# Patient Record
Sex: Female | Born: 1945 | Race: Black or African American | Hispanic: No | Marital: Married | Smoking: Never smoker
Health system: Southern US, Community
[De-identification: ages and names within clinical notes are randomized; demographics above are authoritative.]

## PROBLEM LIST (undated history)

## (undated) DIAGNOSIS — C50919 Malignant neoplasm of unspecified site of unspecified female breast: Secondary | ICD-10-CM

## (undated) DIAGNOSIS — R51 Headache: Secondary | ICD-10-CM

## (undated) DIAGNOSIS — M1990 Unspecified osteoarthritis, unspecified site: Secondary | ICD-10-CM

## (undated) DIAGNOSIS — E7800 Pure hypercholesterolemia, unspecified: Secondary | ICD-10-CM

## (undated) DIAGNOSIS — Z889 Allergy status to unspecified drugs, medicaments and biological substances status: Secondary | ICD-10-CM

## (undated) DIAGNOSIS — Z923 Personal history of irradiation: Secondary | ICD-10-CM

## (undated) DIAGNOSIS — M25569 Pain in unspecified knee: Secondary | ICD-10-CM

## (undated) DIAGNOSIS — R519 Headache, unspecified: Secondary | ICD-10-CM

## (undated) HISTORY — DX: Headache: R51

## (undated) HISTORY — PX: BREAST BIOPSY: SHX20

## (undated) HISTORY — PX: ABDOMINAL HYSTERECTOMY: SHX81

## (undated) HISTORY — DX: Headache, unspecified: R51.9

## (undated) HISTORY — PX: TUBAL LIGATION: SHX77

---

## 2014-04-11 ENCOUNTER — Other Ambulatory Visit: Payer: Self-pay

## 2014-04-11 DIAGNOSIS — Z1231 Encounter for screening mammogram for malignant neoplasm of breast: Secondary | ICD-10-CM

## 2014-04-26 ENCOUNTER — Ambulatory Visit
Admission: RE | Admit: 2014-04-26 | Discharge: 2014-04-26 | Disposition: A | Discharge status: Home / Self Care | Payer: Commercial Managed Care - HMO | Source: Ambulatory Visit

## 2014-04-26 DIAGNOSIS — Z1231 Encounter for screening mammogram for malignant neoplasm of breast: Secondary | ICD-10-CM

## 2014-08-26 ENCOUNTER — Emergency Department (HOSPITAL_BASED_OUTPATIENT_CLINIC_OR_DEPARTMENT_OTHER)
Admission: EM | Admit: 2014-08-26 | Discharge: 2014-08-26 | Disposition: A | Discharge status: Home / Self Care | Payer: Commercial Managed Care - HMO | Attending: Emergency Medicine | Admitting: Emergency Medicine

## 2014-08-26 ENCOUNTER — Encounter (HOSPITAL_BASED_OUTPATIENT_CLINIC_OR_DEPARTMENT_OTHER): Payer: Self-pay | Admitting: Emergency Medicine

## 2014-08-26 ENCOUNTER — Emergency Department (HOSPITAL_BASED_OUTPATIENT_CLINIC_OR_DEPARTMENT_OTHER): Payer: Commercial Managed Care - HMO

## 2014-08-26 DIAGNOSIS — M25512 Pain in left shoulder: Secondary | ICD-10-CM | POA: Diagnosis present

## 2014-08-26 DIAGNOSIS — Z79899 Other long term (current) drug therapy: Secondary | ICD-10-CM | POA: Insufficient documentation

## 2014-08-26 DIAGNOSIS — M62830 Muscle spasm of back: Secondary | ICD-10-CM | POA: Diagnosis not present

## 2014-08-26 DIAGNOSIS — M546 Pain in thoracic spine: Secondary | ICD-10-CM

## 2014-08-26 DIAGNOSIS — M549 Dorsalgia, unspecified: Secondary | ICD-10-CM

## 2014-08-26 DIAGNOSIS — E780 Pure hypercholesterolemia: Secondary | ICD-10-CM | POA: Diagnosis not present

## 2014-08-26 DIAGNOSIS — R9431 Abnormal electrocardiogram [ECG] [EKG]: Secondary | ICD-10-CM | POA: Diagnosis not present

## 2014-08-26 HISTORY — DX: Pure hypercholesterolemia, unspecified: E78.00

## 2014-08-26 LAB — CBC WITH DIFFERENTIAL/PLATELET
Basophils Absolute: 0 10*3/uL (ref 0.0–0.1)
Basophils Relative: 1 % (ref 0–1)
Eosinophils Absolute: 0.2 10*3/uL (ref 0.0–0.7)
Eosinophils Relative: 6 % — ABNORMAL HIGH (ref 0–5)
HCT: 37 % (ref 36.0–46.0)
Hemoglobin: 11.7 g/dL — ABNORMAL LOW (ref 12.0–15.0)
Lymphocytes Relative: 44 % (ref 12–46)
Lymphs Abs: 1.4 10*3/uL (ref 0.7–4.0)
MCH: 28 pg (ref 26.0–34.0)
MCHC: 31.6 g/dL (ref 30.0–36.0)
MCV: 88.5 fL (ref 78.0–100.0)
Monocytes Absolute: 0.5 10*3/uL (ref 0.1–1.0)
Monocytes Relative: 16 % — ABNORMAL HIGH (ref 3–12)
NEUTROS PCT: 33 % — AB (ref 43–77)
Neutro Abs: 1.1 10*3/uL — ABNORMAL LOW (ref 1.7–7.7)
PLATELETS: 228 10*3/uL (ref 150–400)
RBC: 4.18 MIL/uL (ref 3.87–5.11)
RDW: 13.7 % (ref 11.5–15.5)
WBC: 3.2 10*3/uL — AB (ref 4.0–10.5)

## 2014-08-26 LAB — BASIC METABOLIC PANEL
ANION GAP: 6 (ref 5–15)
BUN: 19 mg/dL (ref 6–23)
CO2: 26 mmol/L (ref 19–32)
Calcium: 9 mg/dL (ref 8.4–10.5)
Chloride: 107 mEq/L (ref 96–112)
Creatinine, Ser: 0.79 mg/dL (ref 0.50–1.10)
GFR calc Af Amer: >90 mL/min (ref >90)
GFR calc non Af Amer: 85 mL/min — ABNORMAL LOW (ref >90)
GLUCOSE: 111 mg/dL — AB (ref 70–99)
Potassium: 4 mmol/L (ref 3.5–5.1)
Sodium: 139 mmol/L (ref 135–145)

## 2014-08-26 LAB — TROPONIN I: Troponin I: <0.03 ng/mL (ref <0.031)

## 2014-08-26 MED ORDER — DIAZEPAM 2 MG PO TABS
2.0000 mg | ORAL_TABLET | Freq: Once | ORAL | Status: AC
  Administered 2014-08-26: 2 mg via ORAL
  Filled 2014-08-26: qty 1

## 2014-08-26 MED ORDER — HYDROCODONE-ACETAMINOPHEN 5-325 MG PO TABS
1.0000 | ORAL_TABLET | Freq: Once | ORAL | Status: AC
  Administered 2014-08-26: 1 via ORAL
  Filled 2014-08-26: qty 1

## 2014-08-26 MED ORDER — HYDROCODONE-ACETAMINOPHEN 5-325 MG PO TABS
2.0000 | ORAL_TABLET | ORAL | Status: DC | PRN
Start: 2014-08-26 — End: 2016-06-25

## 2014-08-26 MED ORDER — METHOCARBAMOL 500 MG PO TABS: 500.0000 mg | ORAL_TABLET | Freq: Two times a day (BID) | ORAL | Status: DC

## 2014-08-26 MED ORDER — FENTANYL CITRATE 0.05 MG/ML IJ SOLN
50.0000 ug | Freq: Once | INTRAMUSCULAR | Status: AC
  Administered 2014-08-26: 50 ug via INTRAVENOUS
  Filled 2014-08-26: qty 2

## 2014-08-26 MED ORDER — DIAZEPAM 2 MG PO TABS: 2.0000 mg | ORAL_TABLET | Freq: Two times a day (BID) | ORAL | Status: DC

## 2014-08-26 MED ORDER — IOHEXOL 350 MG/ML SOLN
100.0000 mL | Freq: Once | INTRAVENOUS | Status: AC | PRN
  Administered 2014-08-26: 100 mL via INTRAVENOUS

## 2014-08-26 NOTE — ED Provider Notes (Signed)
CSN: [id number]     Arrival date & time 08/26/14  0730 History  This chart was scribed for Caroline Essex, MD by Ludger Nutting, ED Scribe. This patient was seen in room MH03/MH03 and the patient's care was started 7:50 AM.    Chief Complaint  Patient presents with  . Shoulder Pain    The history is provided by the patient and the spouse. No language interpreter was used.     HPI Comments: Caroline Wilson is a 69 y.o. female who presents to the Emergency Department complaining of constant, gradually worsened left shoulder pain that began yesterday. She denies known falls or injuries. She reports similar symptoms in the past but not as severe. She states movement of the LUE aggravates her pain and certain movements alleviates her pain. She has taken Aleve and baby ASA without significant relief. Patient also complains of mild throat tightness that occurred this morning after waking. She states this tightness has since resolved. She denies chest pain, SOB, numbness or tingling.   Past Medical History  Diagnosis Date  . Hypercholesteremia    No past surgical history on file. No family history on file. History  Substance Use Topics  . Smoking status: Never Smoker   . Smokeless tobacco: Not on file  . Alcohol Use: Not on file   OB History    No data available     Review of Systems  A complete 10 system review of systems was obtained and all systems are negative except as noted in the HPI and PMH.    Allergies  Review of patient's allergies indicates no known allergies.  Home Medications   Prior to Admission medications   Medication Sig Start Date End Date Taking? Authorizing Provider  simvastatin (ZOCOR) 20 MG tablet Take 20 mg by mouth daily.   Yes Historical Provider, MD  diazepam (VALIUM) 2 MG tablet Take 1 tablet (2 mg total) by mouth 2 (two) times daily. 08/26/14   Caroline Essex, MD  HYDROcodone-acetaminophen (NORCO/VICODIN) 5-325 MG per tablet Take 2 tablets by mouth every 4  (four) hours as needed. 08/26/14   Caroline Essex, MD  methocarbamol (ROBAXIN) 500 MG tablet Take 1 tablet (500 mg total) by mouth 2 (two) times daily. 08/26/14   Caroline Essex, MD   BP 140/79 mmHg  Pulse 54  Temp(Src) 98 F (36.7 C) (Oral)  Resp 16  Ht 5\' 3"  (1.6 m)  Wt 179 lb (81.194 kg)  BMI 31.72 kg/m2  SpO2 96% Physical Exam  Constitutional: She is oriented to person, place, and time. She appears well-developed and well-nourished. No distress.  Uncomfortable appearing   HENT:  Head: Normocephalic and atraumatic.  Mouth/Throat: Oropharynx is clear and moist. No oropharyngeal exudate.  Eyes: Conjunctivae and EOM are normal. Pupils are equal, round, and reactive to light.  Neck: Normal range of motion. Neck supple.  No meningismus.  Cardiovascular: Normal rate, regular rhythm, normal heart sounds and intact distal pulses.   No murmur heard. Pulmonary/Chest: Effort normal and breath sounds normal. No respiratory distress.  Abdominal: Soft. There is no tenderness. There is no rebound and no guarding.  Musculoskeletal: Normal range of motion. She exhibits tenderness. She exhibits no edema.  Left paraspinal thoracic spasm, rhomboid spasm. Intact radial pulse. 5/5 grip strength. Pain worse with abduction of shoulder.   Neurological: She is alert and oriented to person, place, and time. No cranial nerve deficit. She exhibits normal muscle tone. Coordination normal.  No ataxia on finger to nose bilaterally. No pronator  drift. 5/5 strength throughout. CN 2-12 intact. Negative Romberg. Equal grip strength. Sensation intact. Gait is normal.   Skin: Skin is warm.  Psychiatric: She has a normal mood and affect. Her behavior is normal.  Nursing note and vitals reviewed.   ED Course  Procedures (including critical care time)  DIAGNOSTIC STUDIES: Oxygen Saturation is 99% on RA, normal by my interpretation.    COORDINATION OF CARE: 7:56 AM Discussed treatment plan with pt at bedside and pt  agreed to plan.  9:23 AM Patient rechecked, denies significant change in pain level.    Labs Review Labs Reviewed  CBC WITH DIFFERENTIAL - Abnormal; Notable for the following:    WBC 3.2 (*)    Hemoglobin 11.7 (*)    Neutrophils Relative % 33 (*)    Neutro Abs 1.1 (*)    Monocytes Relative 16 (*)    Eosinophils Relative 6 (*)    All other components within normal limits  BASIC METABOLIC PANEL - Abnormal; Notable for the following:    Glucose, Bld 111 (*)    GFR calc non Af Amer 85 (*)    All other components within normal limits  TROPONIN I    Imaging Review Dg Chest 2 View  08/26/2014   CLINICAL DATA:  Acute upper back pain since yesterday. No reported injury.  EXAM: CHEST  2 VIEW  COMPARISON:  None.  FINDINGS: The heart size and mediastinal contours are within normal limits. Both lungs are clear. No pneumothorax or pleural effusion is noted. The visualized skeletal structures are unremarkable.  IMPRESSION: No acute cardiopulmonary abnormality seen.   Electronically Signed   By: Sabino Dick M.D.   On: 08/26/2014 08:51   Ct Angio Chest Aorta W/cm &/or Wo/cm  08/26/2014   CLINICAL DATA:  Mid thoracic back pain.  EXAM: CT ANGIOGRAPHY CHEST WITH CONTRAST  TECHNIQUE: Multidetector CT imaging of the chest was performed using the standard protocol during bolus administration of intravenous contrast. Multiplanar CT image reconstructions and MIPs were obtained to evaluate the vascular anatomy.  CONTRAST:  14mL OMNIPAQUE IOHEXOL 350 MG/ML SOLN  COMPARISON:  Chest x-ray dated 08/26/2014  FINDINGS: There is no pulmonary embolism, aortic dissection, aortic aneurysm, pulmonary infiltrate, or pleural effusion. Minimal linear atelectasis at the lung bases.  Heart size is normal. No hilar or mediastinal adenopathy. No osseous abnormality. The visualized portion of the upper abdomen is normal.  Review of the MIP images confirms the above findings.  IMPRESSION: Normal exam.   Electronically Signed   By: Rozetta Nunnery M.D.   On: 08/26/2014 11:35     EKG Interpretation   Date/Time:  Monday August 26 2014 08:16:54 EST Ventricular Rate:  56 PR Interval:  154 QRS Duration: 86 QT Interval:  416 QTC Calculation: 401 R Axis:   51 Text Interpretation:  Sinus bradycardia Otherwise normal ECG No previous  ECGs available Confirmed by Armanda Forand  MD, Walden Statz ([id number]) on 08/26/2014  8:19:24 AM      MDM   Final diagnoses:  Back pain  Muscle spasm of back   Left upper back pain since yesterday worse with movement and palpation. Denies trauma. No chest pain or shortness of breath. No weakness, numbness or tingling.  EKG normal sinus rhythm. Left upper back pain tenderness reproducible to palpation.  Chest x-ray with normal mediastinal width. Equal upper extremity blood pressures.  CT negative for aortic dissection or aneurysm. BP improved.   Will treat as muscle spasm with pain control, antiinflammatories, muscle relaxers, follow  up with PCP. Return to the ED with new or worsening symptoms.  I personally performed the services described in this documentation, which was scribed in my presence. The recorded information has been reviewed and is accurate.  Caroline Essex, MD 08/26/14 1535

## 2014-08-26 NOTE — Discharge Instructions (Signed)
Muscle Cramps and Spasms Take the medications as needed for pain and spasm. Do not take her medications if you are working or driving. Follow-up with your doctor. Return to ED if you develop new or worsening symptoms. Muscle cramps and spasms occur when a muscle or muscles tighten and you have no control over this tightening (involuntary muscle contraction). They are a common problem and can develop in any muscle. The most common place is in the calf muscles of the leg. Both muscle cramps and muscle spasms are involuntary muscle contractions, but they also have differences:   Muscle cramps are sporadic and painful. They may last a few seconds to a quarter of an hour. Muscle cramps are often more forceful and last longer than muscle spasms.  Muscle spasms may or may not be painful. They may also last just a few seconds or much longer. CAUSES  It is uncommon for cramps or spasms to be due to a serious underlying problem. In many cases, the cause of cramps or spasms is unknown. Some common causes are:   Overexertion.   Overuse from repetitive motions (doing the same thing over and over).   Remaining in a certain position for a long period of time.   Improper preparation, form, or technique while performing a sport or activity.   Dehydration.   Injury.   Side effects of some medicines.   Abnormally low levels of the salts and ions in your blood (electrolytes), especially potassium and calcium. This could happen if you are taking water pills (diuretics) or you are pregnant.  Some underlying medical problems can make it more likely to develop cramps or spasms. These include, but are not limited to:   Diabetes.   Parkinson disease.   Hormone disorders, such as thyroid problems.   Alcohol abuse.   Diseases specific to muscles, joints, and bones.   Blood vessel disease where not enough blood is getting to the muscles.  HOME CARE INSTRUCTIONS   Stay well hydrated. Drink  enough water and fluids to keep your urine clear or pale yellow.  It may be helpful to massage, stretch, and relax the affected muscle.  For tight or tense muscles, use a warm towel, heating pad, or hot shower water directed to the affected area.  If you are sore or have pain after a cramp or spasm, applying ice to the affected area may relieve discomfort.  Put ice in a plastic bag.  Place a towel between your skin and the bag.  Leave the ice on for 15-20 minutes, 03-04 times a day.  Medicines used to treat a known cause of cramps or spasms may help reduce their frequency or severity. Only take over-the-counter or prescription medicines as directed by your caregiver. SEEK MEDICAL CARE IF:  Your cramps or spasms get more severe, more frequent, or do not improve over time.  MAKE SURE YOU:   Understand these instructions.  Will watch your condition.  Will get help right away if you are not doing well or get worse. Document Released: 01/29/2002 Document Revised: 12/04/2012 Document Reviewed: 07/26/2012 [facility] Patient Information 2015 [place], [place]. This information is not intended to replace advice given to you by your health care provider. Make sure you discuss any questions you have with your health care provider.

## 2014-08-26 NOTE — ED Notes (Signed)
Pt having left shoulder pain since yesterday.  Pt also c/o some throat tightness this am.  No known fever.  No difficulty swallowing.  No known injury.

## 2014-08-29 DIAGNOSIS — R7989 Other specified abnormal findings of blood chemistry: Secondary | ICD-10-CM | POA: Diagnosis not present

## 2014-08-29 DIAGNOSIS — M791 Myalgia: Secondary | ICD-10-CM | POA: Diagnosis not present

## 2014-10-01 DIAGNOSIS — Z111 Encounter for screening for respiratory tuberculosis: Secondary | ICD-10-CM | POA: Diagnosis not present

## 2014-10-01 DIAGNOSIS — Z021 Encounter for pre-employment examination: Secondary | ICD-10-CM | POA: Diagnosis not present

## 2014-10-18 DIAGNOSIS — H5210 Myopia, unspecified eye: Secondary | ICD-10-CM | POA: Diagnosis not present

## 2014-12-02 DIAGNOSIS — R51 Headache: Secondary | ICD-10-CM | POA: Diagnosis not present

## 2014-12-02 DIAGNOSIS — E780 Pure hypercholesterolemia: Secondary | ICD-10-CM | POA: Diagnosis not present

## 2014-12-02 DIAGNOSIS — Z79899 Other long term (current) drug therapy: Secondary | ICD-10-CM | POA: Diagnosis not present

## 2014-12-02 DIAGNOSIS — M549 Dorsalgia, unspecified: Secondary | ICD-10-CM | POA: Diagnosis not present

## 2014-12-10 ENCOUNTER — Ambulatory Visit: Payer: Commercial Managed Care - HMO | Attending: Family Medicine

## 2014-12-10 DIAGNOSIS — M545 Low back pain, unspecified: Secondary | ICD-10-CM

## 2014-12-10 DIAGNOSIS — G44221 Chronic tension-type headache, intractable: Secondary | ICD-10-CM

## 2014-12-10 DIAGNOSIS — M25659 Stiffness of unspecified hip, not elsewhere classified: Secondary | ICD-10-CM | POA: Insufficient documentation

## 2014-12-10 DIAGNOSIS — G44201 Tension-type headache, unspecified, intractable: Secondary | ICD-10-CM | POA: Diagnosis not present

## 2014-12-10 NOTE — Patient Instructions (Signed)
Perform all exercises below:  Hold _20___ seconds. Repeat _3___ times.  Do __3__ sessions per day. CAUTION: Movement should be gentle, steady and slow.  Knee to Chest  Lying supine, bend involved knee to chest. Perform with each leg.  Copyright  VHI. All rights reserved.  Double Knee to Chest (Flexion)   Gently pull both knees toward chest. Feel stretch in lower back or buttock area. Breathing deeply, Lumbar Rotation: Caudal - Bilateral (Supine)  Feet and knees together, arms outstretched, rotate knees left, turning head in opposite direction, until stretch is felt.      PERFORM ALL EXERCISES GENTLY AND WITH GOOD POSTURE.    20 SECOND HOLD, 3 REPS TO EACH SIDE. 4-5 TIMES EACH DAY.   AROM: Neck Rotation   Turn head slowly to look over one shoulder, then the other.   AROM: Neck Flexion   Bend head forward.   AROM: Lateral Neck Flexion   Slowly tilt head toward one shoulder, then the other.

## 2014-12-10 NOTE — Therapy (Signed)
[facility] [address] [place], [place], [address] Phone: [phone]   Fax:  [phone]  Physical Therapy Evaluation  Patient Details  Name: Caroline Wilson MRN: [id number] Date of Birth: December 01, 1945 Referring Provider:  Leighton Ruff, MD  Encounter Date: 12/10/2014      PT End of Session - 12/10/14 1103    Visit Number 1   Number of Visits 10  Medicare   Date for PT Re-Evaluation 02/04/15   PT Start Time 2694   PT Stop Time 1118   PT Time Calculation (min) 57 min   Activity Tolerance Patient tolerated treatment well   Behavior During Therapy [facility] for tasks assessed/performed      Past Medical History  Diagnosis Date  . Hypercholesteremia     History reviewed. No pertinent past surgical history.  There were no vitals filed for this visit.  Visit Diagnosis:  Bilateral low back pain without sciatica - Plan: PT plan of care cert/re-cert  Chronic tension-type headache, intractable - Plan: PT plan of care cert/re-cert  Hip stiffness, unspecified laterality - Plan: PT plan of care cert/re-cert      Subjective Assessment - 12/10/14 1028    Subjective Pt is a 69 y.o. female who presents to PT with chronic history of LBP without incident or injury.  Pt with new onset of headaches 5 months ago.     Limitations Standing;Walking   How long can you stand comfortably? 1 hour max   How long can you walk comfortably? 1 hour at the gym   Diagnostic tests MRI at the hospital 08/2014 for muscle spasms in the thoracic spine- results negative   Patient Stated Goals reduce pain, stand longer, reduce headaches   Currently in Pain? Yes   Pain Score 8   8/10 max, 5/10 on average   Pain Location Back   Pain Orientation Right;Left   Pain Descriptors / Indicators Aching;Tightness   Pain Type Chronic pain   Pain Onset More than a month ago   Pain Frequency Constant   Aggravating Factors  early morning hours, sitting still  for too long in the car, housework, walking/standing > 1 hour   Pain Relieving Factors Aleve, some stretching   Effect of Pain on Daily Activities limited standing, pain with housework   Multiple Pain Sites Yes   Pain Score 6   Pain Location Head  headache   Pain Orientation Left;Right   Pain Descriptors / Indicators Aching;Throbbing   Pain Type Chronic pain   Pain Onset More than a month ago   Pain Frequency Intermittent   Aggravating Factors  unknown- pt denies a pain pattern   Pain Relieving Factors Aleve- doesn't always help            [facility] PT Assessment - 12/10/14 0001    Assessment   Medical Diagnosis back pain (M54.9) and headaches   Onset Date 12/10/11  Headaches started 06/2014   Next MD Visit none   Precautions   Precautions None   Restrictions   Weight Bearing Restrictions No   Balance Screen   Has the patient fallen in the past 6 months No   Has the patient had a decrease in activity level because of a fear of falling?  No   Is the patient reluctant to leave their home because of a fear of falling?  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  No pain repored with cervical AROM.  Lumbar AROM is full with  lumbar pain reported with end range flexion and Lt sidebending.     PROM   Overall PROM  Deficits   Overall PROM Comments Hip flexibility is limited by 25% in all directions without pain.     Strength   Overall Strength Within functional limits for tasks performed   Overall Strength Comments UE and LE strength 4+/5 bilaterally   Palpation   Palpation Pt with muscle tension noted in Rt and Lt UT and suboccipitals.  Tenderness with palpation to suboccipitals.    Palpabalbe tenderness over bilateral SI joint and deep gluteals.  No tenderness over lumbar paraspinals.     Ambulation/Gait   Ambulation/Gait Yes                   [facility] PT Treatment/Exercise - 12/10/14 0001    Exercises   Exercises Knee/Hip;Neck;Lumbar   Neck Exercises: Seated   Other Seated Exercise cervical AROM: 3 ways 3x20 seconds   Lumbar Exercises: Stretches   Single Knee to Chest Stretch 3 reps;20 seconds   Double Knee to Chest Stretch 3 reps;20 seconds   Lower Trunk Rotation 3 reps;20 seconds   Modalities   Modalities Electrical Stimulation;Moist Heat   Moist Heat Therapy   Number Minutes Moist Heat 15 Minutes   Moist Heat Location Other (comment)  neck and low back   Electrical Stimulation   Electrical Stimulation Location lumbar    Electrical Stimulation Action IFC   Electrical Stimulation Parameters 15   Electrical Stimulation Goals Pain                PT Education - 12/10/14 1049    Education provided Yes   Education Details lumbar flexibility: trunk rotation, single and double knee to chest.  Neck AROM: 3 ways   Person(s) Educated Patient   Methods Explanation;Demonstration;Handout   Comprehension Verbalized understanding;Returned demonstration          PT Short Term Goals - 12/10/14 1107    PT SHORT TERM GOAL #1   Title be independent in initial HEP   Time 4   Period Weeks   Status New   PT SHORT TERM GOAL #2   Title report a 25% reduction in LBP with standing tasks  and ADLs   Time 4   Period Weeks   Status New   PT SHORT TERM GOAL #3   Title reporta 25% reduction in the frequency and intensity of headaches   Time 4   Period Weeks   Status New           PT Long Term Goals - 12/10/14 1107    PT LONG TERM GOAL #1   Title be independent in advanced HEP   Time 8   Period Weeks  Status New   PT LONG TERM GOAL #2   Title reduce FOTO to < or = to 40% limitation   Time 8   Period Weeks   Status New   PT LONG TERM GOAL #3   Title report a 50% reduction in LBP with standing tasks and ADLs   Time 8   Period Weeks   Status New   PT LONG TERM GOAL #5   Title report a 50% reduction in the frequency and intensity of headaches   Time 8   Period Weeks   Status New               Plan - 12-17-2014 1104    Clinical Impression Statement Pt presents with chronic LBP, LE/hip stiffness and limited functional mobility.  Pt also with headaches and head/neck tension.  Pt will benefit from PT to address flexibility, muscle tension, strength and body mechanics   Pt will benefit from skilled therapeutic intervention in order to improve on the following deficits Decreased range of motion;Postural dysfunction;Improper body mechanics;Decreased activity tolerance;Pain;Increased muscle spasms   Rehab Potential Good   PT Frequency 2x / week   PT Duration 8 weeks   PT Treatment/Interventions ADLs/Self Care Home Management;Moist Heat;Therapeutic activities;Patient/family education;Traction;Therapeutic exercise;Passive range of motion;Manual techniques;Ultrasound;Cryotherapy;Neuromuscular re-education;Electrical Stimulation   PT Next Visit Plan Lumbar and hip flexibility, cervical flexibility, postural strength, body mechanics, modalities and manual   Consulted and Agree with Plan of Care Patient          G-Codes - 12/17/14 1051    Functional Assessment Tool Used FOTO: 45% limitation   Functional Limitation Other PT primary   Other PT Primary Current  Status (G9211) At least 40 percent but less than 60 percent impaired, limited or restricted   Other PT Primary Goal Status (H4174) At least 40 percent but less than 60 percent impaired, limited or restricted       Problem List There are no active problems to display for this patient.   TAKACS,KELLY, PT 12-17-14, 11:20 AM  [facility] [address], [place], [place], [address] Phone: [phone]   Fax:  [phone]

## 2014-12-17 ENCOUNTER — Ambulatory Visit: Payer: Commercial Managed Care - HMO

## 2014-12-17 DIAGNOSIS — M545 Low back pain, unspecified: Secondary | ICD-10-CM

## 2014-12-17 DIAGNOSIS — M25659 Stiffness of unspecified hip, not elsewhere classified: Secondary | ICD-10-CM

## 2014-12-17 DIAGNOSIS — G44221 Chronic tension-type headache, intractable: Secondary | ICD-10-CM

## 2014-12-17 DIAGNOSIS — G44201 Tension-type headache, unspecified, intractable: Secondary | ICD-10-CM | POA: Diagnosis not present

## 2014-12-17 NOTE — Therapy (Signed)
[facility] [address], [place], [place], [address] Phone: [phone]   Fax:  [phone]  Physical Therapy Treatment  Patient Details  Name: Caroline Wilson MRN: [id number] Date of Birth: August 03, 1946 Referring Provider:  Leighton Ruff, MD  Encounter Date: 12/17/2014      PT End of Session - 12/17/14 0842    Visit Number 2   Number of Visits 10  Medicare   Date for PT Re-Evaluation 02/04/15   PT Start Time 0806   PT Stop Time 0859   PT Time Calculation (min) 53 min   Activity Tolerance Patient tolerated treatment well   Behavior During Therapy [facility] for tasks assessed/performed      Past Medical History  Diagnosis Date  . Hypercholesteremia     History reviewed. No pertinent past surgical history.  There were no vitals filed for this visit.  Visit Diagnosis:  Bilateral low back pain without sciatica  Chronic tension-type headache, intractable  Hip stiffness, unspecified laterality      Subjective Assessment - 12/17/14 0809    Subjective Pt denies any change in headaches.  Pt has been doing exercises.  E-stim and heat helped last session.     Currently in Pain? Yes   Pain Score 5    Pain Location Back   Pain Orientation Right;Left   Pain Descriptors / Indicators Aching;Tightness   Pain Type Chronic pain   Pain Onset More than a month ago   Pain Frequency Constant   Aggravating Factors  early morning hours, housework, standing/walking >1 hour   Pain Relieving Factors Aleve   Multiple Pain Sites Yes   Pain Score 6   Pain Location Head   Pain Orientation Right;Left   Pain Descriptors / Indicators Aching;Throbbing   Pain Type Chronic pain   Pain Onset More than a month ago   Pain Frequency Intermittent   Aggravating Factors  unknonwn pattern                         [facility] Adult PT Treatment/Exercise - 12/17/14 0001    Neck Exercises: Seated   Other Seated Exercise cervical  AROM: 3 ways 3x20 seconds   Lumbar Exercises: Stretches   Active Hamstring Stretch 3 reps;20 seconds   Single Knee to Chest Stretch 3 reps;20 seconds   Double Knee to Chest Stretch 3 reps;20 seconds   Lower Trunk Rotation 3 reps;20 seconds  good return demo of all HEP   Lumbar Exercises: Aerobic   Stationary Bike Level 2x 6 minutes  PT present to discuss progress   Modalities   Modalities Electrical Stimulation;Moist Heat;Ultrasound   Moist Heat Therapy   Number Minutes Moist Heat 15 Minutes   Moist Heat Location Other (comment)  neck and lumbar   Electrical Stimulation   Electrical Stimulation Location lumbar   Electrical Stimulation Action IFC   Electrical Stimulation Parameters 15   Electrical Stimulation Goals Pain   Ultrasound   Ultrasound Location neck   Ultrasound Parameters 1.2 w/cm2 continuous x 6 minutes   Ultrasound Goals Pain                PT Education - 12/17/14 6440    Education provided Yes   Education Details HEP: chin tuck in supine, seated hamstring stretch.     Person(s) Educated Patient   Methods Demonstration;Handout;Explanation   Comprehension Verbalized understanding;Returned demonstration          PT Short Term Goals - 12/17/14  0811    PT SHORT TERM GOAL #1   Title be independent in initial HEP   Time 4   Period Weeks   Status --  independent in initial HEP from 1st session   PT [place] #2   Title report a 25% reduction in LBP with standing tasks and ADLs   Time 4   Period Weeks   Status On-going  No significant change since the start of care   PT SHORT TERM GOAL #3   Title reporta 25% reduction in the frequency and intensity of headaches   Time 4   Period Weeks   Status On-going  No change since the start of care           PT Long Term Goals - 12/10/14 1107    PT LONG TERM GOAL #1   Title be independent in advanced HEP   Time 8   Period Weeks   Status New   PT LONG TERM GOAL #2   Title reduce FOTO to < or  = to 40% limitation   Time 8   Period Weeks   Status New   PT LONG TERM GOAL #3   Title report a 50% reduction in LBP with standing tasks and ADLs   Time 8   Period Weeks   Status New   PT LONG TERM GOAL #5   Title report a 50% reduction in the frequency and intensity of headaches   Time 8   Period Weeks   Status New               Plan - 12/17/14 8891    Clinical Impression Statement Pt with only 1 session since evaluation.  Pt is independent in initial HEP.  No significant progress toward goals secondary to only 1 session.  Pt with continued LBP and LE/hip stiffness.     Pt will benefit from skilled therapeutic intervention in order to improve on the following deficits Decreased range of motion;Postural dysfunction;Improper body mechanics;Decreased activity tolerance;Pain;Increased muscle spasms   Rehab Potential Good   PT Frequency 2x / week   PT Duration 8 weeks   PT Treatment/Interventions ADLs/Self Care Home Management;Moist Heat;Therapeutic activities;Patient/family education;Traction;Therapeutic exercise;Passive range of motion;Manual techniques;Ultrasound;Cryotherapy;Neuromuscular re-education;Electrical Stimulation   PT Next Visit Plan Lumbar and hip flexibility, cervical flexibility, postural strength,  modalities and manual.  Body mechanics education for home tasks.    Consulted and Agree with Plan of Care Patient        Problem List There are no active problems to display for this patient.   TAKACS,KELLY, PT 12/17/2014, 8:43 AM  [facility] [address], [place], [place], [address] Phone: [phone]   Fax:  [phone]

## 2014-12-17 NOTE — Patient Instructions (Signed)
Head Press With [place] chin SLIGHTLY toward chest, keep mouth closed. Feel weight on back of head. Increase weight by pressing head down. Hold 5__ seconds. Relax. Repeat 10___ times. Surface: floor.  Do 2-3 times a day  Copyright  VHI. All rights reserved.  HIP: Hamstrings - Short Sitting   Rest leg on raised surface. Keep knee straight. Lift chest. Hold _20__ seconds. 3___ reps per set, _3__ sets per day Copyright  VHI. All rights reserved.

## 2014-12-24 ENCOUNTER — Ambulatory Visit: Payer: Commercial Managed Care - HMO | Attending: Family Medicine

## 2014-12-24 DIAGNOSIS — G44221 Chronic tension-type headache, intractable: Secondary | ICD-10-CM

## 2014-12-24 DIAGNOSIS — G44201 Tension-type headache, unspecified, intractable: Secondary | ICD-10-CM | POA: Diagnosis not present

## 2014-12-24 DIAGNOSIS — M545 Low back pain, unspecified: Secondary | ICD-10-CM

## 2014-12-24 DIAGNOSIS — M25659 Stiffness of unspecified hip, not elsewhere classified: Secondary | ICD-10-CM | POA: Insufficient documentation

## 2014-12-24 NOTE — Patient Instructions (Signed)
TIGHTEN YOUR ABDOMINALS AS YOU DO THIS:  ABDUCTION: Standing (Active)   Stand, feet flat. Lift right leg out to side. Use _0__ lbs. Complete _2x_10_ repetitions. Perform __2_ sessions per day.   EXTENSION: Standing (Active)  Stand, both feet flat. Draw right leg behind body as far as possible. Use 0___ lbs. Complete 10 repetitions. Perform __2_ sessions per day.  Copyright  VHI. All rights reserved.   KEEP HEAD IN NEUTRAL AND SHOULDERS DOWN AND RELAXED   Both arms at the same time. Pull arm back, elbow straight. Repeat __10__ times per set. Do __2__ sets per session. Do _1-2___ sessions per day.  Copyright  VHI. All rights reserved.     With resistive band anchored in door, grasp both ends. Keeping elbows bent, pull back, squeezing shoulder blades together. Hold _3__ seconds. Repeat _2x10___ times. Do _1-2___ sessions per day.  [email or web address].[email or web address]

## 2014-12-24 NOTE — Therapy (Signed)
[facility] [address], [place], [place], [address] Phone: [phone]   Fax:  [phone]  Physical Therapy Treatment  Patient Details  Name: Caroline Wilson MRN: [id number] Date of Birth: 1946-01-05 Referring Provider:  Leighton Ruff, MD  Encounter Date: 12/24/2014      PT End of Session - 12/24/14 0958    Visit Number 3   Number of Visits 10  Medicare   Date for PT Re-Evaluation 02/04/15   PT Start Time 0931   PT Stop Time 1032   PT Time Calculation (min) 61 min   Activity Tolerance Patient tolerated treatment well   Behavior During Therapy [facility] for tasks assessed/performed      Past Medical History  Diagnosis Date  . Hypercholesteremia     History reviewed. No pertinent past surgical history.  There were no vitals filed for this visit.  Visit Diagnosis:  Bilateral low back pain without sciatica  Chronic tension-type headache, intractable  Hip stiffness, unspecified laterality      Subjective Assessment - 12/24/14 0934    Subjective Pain is better.  Headaches are not as frequent.     Currently in Pain? Yes   Pain Score 5    Pain Location Back   Pain Orientation Right;Left   Pain Descriptors / Indicators Tightness   Pain Type Chronic pain   Pain Onset More than a month ago   Pain Frequency Intermittent   Aggravating Factors  early morning hours, standing long periods, housework   Pain Relieving Factors Aleve when pain is bad, sitting upright with pillow behind the back.     Multiple Pain Sites Yes   Pain Score 5   Pain Location Head   Pain Orientation Right;Left   Pain Descriptors / Indicators Aching;Throbbing   Pain Type Chronic pain   Pain Onset More than a month ago   Pain Frequency Intermittent   Aggravating Factors  unknown pattern   Pain Relieving Factors meds dont help                         [facility] Adult PT Treatment/Exercise - 12/24/14 0001    Neck Exercises:  Theraband   Scapula Retraction 20 reps;Red   Shoulder Extension 20 reps;Red  added to HEP   Neck Exercises: Seated   Other Seated Exercise cervical AROM: 3 ways 1x20 seconds   Lumbar Exercises: Stretches   Active Hamstring Stretch 3 reps;20 seconds   Single Knee to Chest Stretch 3 reps;20 seconds   Double Knee to Chest Stretch 3 reps;20 seconds   Lower Trunk Rotation 3 reps;20 seconds   Lumbar Exercises: Aerobic   Stationary Bike Level 2x 8 minutes  PT present to discuss progress   UBE (Upper Arm Bike) Level 1 x 5 minutes   Lumbar Exercises: Standing   Other Standing Lumbar Exercises standing hip abduction and extension 2x10  added to HEP   Modalities   Modalities Electrical Stimulation;Moist Heat   Moist Heat Therapy   Number Minutes Moist Heat 15 Minutes   Moist Heat Location Other (comment)  neck and lumbar   Electrical Stimulation   Electrical Stimulation Location lumbar   Electrical Stimulation Action IFC   Electrical Stimulation Parameters 15   Electrical Stimulation Goals Pain                PT Education - 12/24/14 0944    Education provided Yes   Education Details HEP: red theraband scapular attached, standing  hip abduction and extension   Person(s) Educated Patient   Methods Explanation;Handout;Demonstration   Comprehension Verbalized understanding;Returned demonstration          PT Short Term Goals - 12/24/14 0937    PT SHORT TERM GOAL #1   Title be independent in initial HEP   Time 4   Status Achieved   PT SHORT TERM GOAL #2   Title report a 25% reduction in LBP with standing tasks and ADLs   Time 4   Period Weeks   Status Achieved  50%    PT SHORT TERM GOAL #3   Title reporta 25% reduction in the frequency and intensity of headaches   Time 4   Period Weeks   Status Achieved  50% improvement           PT Long Term Goals - 12/10/14 1107    PT LONG TERM GOAL #1   Title be independent in advanced HEP   Time 8   Period Weeks    Status New   PT LONG TERM GOAL #2   Title reduce FOTO to < or = to 40% limitation   Time 8   Period Weeks   Status New   PT LONG TERM GOAL #3   Title report a 50% reduction in LBP with standing tasks and ADLs   Time 8   Period Weeks   Status New   PT LONG TERM GOAL #5   Title report a 50% reduction in the frequency and intensity of headaches   Time 8   Period Weeks   Status New               Plan - 12/24/14 1001    Clinical Impression Statement Pt with 50% overall improvement in symptoms since the start of care.  Pt with continued LBP and intermittent headaches.  Pt is tolerating advancement of HEP well.  See goals for status.  All STGs have been met.     Pt will benefit from skilled therapeutic intervention in order to improve on the following deficits Decreased range of motion;Postural dysfunction;Improper body mechanics;Decreased activity tolerance;Pain;Increased muscle spasms   Rehab Potential Good   PT Frequency 2x / week   PT Duration 8 weeks   PT Treatment/Interventions ADLs/Self Care Home Management;Moist Heat;Therapeutic activities;Patient/family education;Traction;Therapeutic exercise;Passive range of motion;Manual techniques;Ultrasound;Cryotherapy;Neuromuscular re-education;Electrical Stimulation   PT Next Visit Plan Lumbar and hip flexibility, cervical flexibility, postural strength,  modalities and manual.  Body mechanics education for home tasks.    Consulted and Agree with Plan of Care Patient        Problem List There are no active problems to display for this patient.   Romeo Zielinski, PT 12/24/2014, 10:05 AM  [facility] [address] [place], [place], [address] Phone: [phone]   Fax:  [phone]

## 2015-01-07 ENCOUNTER — Ambulatory Visit: Payer: Commercial Managed Care - HMO

## 2015-01-07 DIAGNOSIS — M545 Low back pain, unspecified: Secondary | ICD-10-CM

## 2015-01-07 DIAGNOSIS — M25659 Stiffness of unspecified hip, not elsewhere classified: Secondary | ICD-10-CM | POA: Diagnosis not present

## 2015-01-07 DIAGNOSIS — G44201 Tension-type headache, unspecified, intractable: Secondary | ICD-10-CM | POA: Diagnosis not present

## 2015-01-07 DIAGNOSIS — G44221 Chronic tension-type headache, intractable: Secondary | ICD-10-CM

## 2015-01-07 NOTE — Therapy (Addendum)
[facility] [address] [place], [place], [address] Phone: [phone]   Fax:  [phone]  Physical Therapy Treatment  Patient Details  Name: Caroline Wilson MRN: [id number] Date of Birth: 06-Mar-1946 Referring Provider:  Leighton Ruff, MD  Encounter Date: 01/07/2015      PT End of Session - 01/07/15 1000    Visit Number 4   Number of Visits 10  Medicare   PT Start Time 0930   PT Stop Time 1028   PT Time Calculation (min) 58 min   Activity Tolerance Patient tolerated treatment well   Behavior During Therapy [facility] for tasks assessed/performed      Past Medical History  Diagnosis Date  . Hypercholesteremia     History reviewed. No pertinent past surgical history.  There were no vitals filed for this visit.  Visit Diagnosis:  Bilateral low back pain without sciatica  Chronic tension-type headache, intractable  Hip stiffness, unspecified laterality      Subjective Assessment - 01/07/15 0935    Subjective Pt reports mild and infrequent headaches.  LBP is 50% better since starting PT.   Currently in Pain? Yes   Pain Score 0-No pain  up to 7/10 with activity or wearing heels   Pain Location Back   Pain Orientation Right   Pain Descriptors / Indicators Tightness   Pain Type Chronic pain   Aggravating Factors  housework, lifting, walking/standing long periods   Pain Relieving Factors heat, back support   Multiple Pain Sites No                         [facility] Adult PT Treatment/Exercise - 01/07/15 0001    Neck Exercises: Seated   Other Seated Exercise cervical AROM: 3 ways 1x20 seconds   Lumbar Exercises: Aerobic   Stationary Bike Level 2x 8 minutes  PT present to discuss progress   UBE (Upper Arm Bike) Level 1 x 6 minutes   Lumbar Exercises: Standing   Other Standing Lumbar Exercises standing hip abduction and extension 2x10  added to HEP   Lumbar Exercises: Supine   Other Supine  Lumbar Exercises supine on foam roll for dcompression x 5 minutes   Other Supine Lumbar Exercises yellow theraband: horizontal abduction 3x10 on foam roll  tactile cues to reduce scapular elevation   Lumbar Exercises: Quadruped   Other Quadruped Lumbar Exercises Prayer stretch 3x20 sec  pillow behind buttock   Modalities   Modalities Electrical Stimulation;Moist Heat   Moist Heat Therapy   Number Minutes Moist Heat 15 Minutes   Moist Heat Location Other (comment)  neck and lumbar   Electrical Stimulation   Electrical Stimulation Location lumbar   Electrical Stimulation Action IFC   Electrical Stimulation Parameters 15   Electrical Stimulation Goals Pain                  PT Short Term Goals - 12/24/14 9030    PT SHORT TERM GOAL #1   Title be independent in initial HEP   Time 4   Status Achieved   PT SHORT TERM GOAL #2   Title report a 25% reduction in LBP with standing tasks and ADLs   Time 4   Period Weeks   Status Achieved  50%    PT SHORT TERM GOAL #3   Title reporta 25% reduction in the frequency and intensity of headaches   Time 4   Period Weeks   Status Achieved  50% improvement           PT Long Term Goals - 01/07/15 0937    PT LONG TERM GOAL #1   Title be independent in advanced HEP   Time 8   Period Weeks   Status On-going  Pt is independent in current HEP   PT LONG TERM GOAL #3   Title report a 50% reduction in LBP with standing tasks and ADLs   Time 8   Period Weeks   Status On-going  40-50% improvement with standing               Problem List There are no active problems to display for this patient.   TAKACS,KELLY, PT 01/07/2015, 10:02 AM PHYSICAL THERAPY DISCHARGE SUMMARY  Visits from Start of Care: 4  Current functional level related to goals / functional outcomes: Pt attended 4 PT sessions and didn't return for further PT sessions.  See above for goals status at final visit.    Remaining deficits: Unknown as pt  didn't return to PT.  Thank you for this referral.   Education / Equipment: HEP, body mechanics education Plan: Patient agrees to discharge.  Patient goals were partially met. Patient is being discharged due to not returning since the last visit.  ?????   Sigurd Sos, PT 01/30/2015 2:24 PM  [facility] [address], [place], [place], [address] Phone: [phone]   Fax:  [phone]

## 2015-04-04 ENCOUNTER — Other Ambulatory Visit: Payer: Self-pay

## 2015-04-04 DIAGNOSIS — Z1231 Encounter for screening mammogram for malignant neoplasm of breast: Secondary | ICD-10-CM

## 2015-04-17 DIAGNOSIS — R232 Flushing: Secondary | ICD-10-CM | POA: Diagnosis not present

## 2015-04-17 DIAGNOSIS — N951 Menopausal and female climacteric states: Secondary | ICD-10-CM | POA: Diagnosis not present

## 2015-04-17 DIAGNOSIS — R635 Abnormal weight gain: Secondary | ICD-10-CM | POA: Diagnosis not present

## 2015-05-01 ENCOUNTER — Other Ambulatory Visit: Payer: Self-pay | Admitting: Obstetrics & Gynecology

## 2015-05-01 DIAGNOSIS — R5381 Other malaise: Secondary | ICD-10-CM

## 2015-05-14 ENCOUNTER — Ambulatory Visit
Admission: RE | Admit: 2015-05-14 | Discharge: 2015-05-14 | Disposition: A | Discharge status: Home / Self Care | Payer: Commercial Managed Care - HMO | Source: Ambulatory Visit

## 2015-05-14 DIAGNOSIS — Z1231 Encounter for screening mammogram for malignant neoplasm of breast: Secondary | ICD-10-CM

## 2015-05-22 ENCOUNTER — Other Ambulatory Visit: Payer: Self-pay | Admitting: Obstetrics & Gynecology

## 2015-05-22 DIAGNOSIS — E2839 Other primary ovarian failure: Secondary | ICD-10-CM

## 2015-06-23 DIAGNOSIS — E7800 Pure hypercholesterolemia, unspecified: Secondary | ICD-10-CM | POA: Diagnosis not present

## 2015-06-23 DIAGNOSIS — L600 Ingrowing nail: Secondary | ICD-10-CM | POA: Diagnosis not present

## 2015-06-23 DIAGNOSIS — Z23 Encounter for immunization: Secondary | ICD-10-CM | POA: Diagnosis not present

## 2015-06-23 DIAGNOSIS — M8588 Other specified disorders of bone density and structure, other site: Secondary | ICD-10-CM | POA: Diagnosis not present

## 2015-06-23 DIAGNOSIS — Z79899 Other long term (current) drug therapy: Secondary | ICD-10-CM | POA: Diagnosis not present

## 2015-06-26 ENCOUNTER — Ambulatory Visit
Admission: RE | Admit: 2015-06-26 | Discharge: 2015-06-26 | Disposition: A | Discharge status: Home / Self Care | Payer: Commercial Managed Care - HMO | Source: Ambulatory Visit | Attending: Obstetrics & Gynecology | Admitting: Obstetrics & Gynecology

## 2015-06-26 DIAGNOSIS — E2839 Other primary ovarian failure: Secondary | ICD-10-CM

## 2015-06-26 DIAGNOSIS — M8589 Other specified disorders of bone density and structure, multiple sites: Secondary | ICD-10-CM | POA: Diagnosis not present

## 2015-07-14 DIAGNOSIS — M2011 Hallux valgus (acquired), right foot: Secondary | ICD-10-CM | POA: Diagnosis not present

## 2015-07-14 DIAGNOSIS — L03032 Cellulitis of left toe: Secondary | ICD-10-CM | POA: Diagnosis not present

## 2015-07-14 DIAGNOSIS — M2012 Hallux valgus (acquired), left foot: Secondary | ICD-10-CM | POA: Diagnosis not present

## 2015-07-14 DIAGNOSIS — M79609 Pain in unspecified limb: Secondary | ICD-10-CM | POA: Diagnosis not present

## 2015-10-29 DIAGNOSIS — Z1159 Encounter for screening for other viral diseases: Secondary | ICD-10-CM | POA: Diagnosis not present

## 2015-10-29 DIAGNOSIS — M79604 Pain in right leg: Secondary | ICD-10-CM | POA: Diagnosis not present

## 2015-10-29 DIAGNOSIS — E7800 Pure hypercholesterolemia, unspecified: Secondary | ICD-10-CM | POA: Diagnosis not present

## 2015-10-29 DIAGNOSIS — Z79899 Other long term (current) drug therapy: Secondary | ICD-10-CM | POA: Diagnosis not present

## 2015-12-23 DIAGNOSIS — Z23 Encounter for immunization: Secondary | ICD-10-CM | POA: Diagnosis not present

## 2015-12-23 DIAGNOSIS — E7800 Pure hypercholesterolemia, unspecified: Secondary | ICD-10-CM | POA: Diagnosis not present

## 2015-12-23 DIAGNOSIS — Z1211 Encounter for screening for malignant neoplasm of colon: Secondary | ICD-10-CM | POA: Diagnosis not present

## 2015-12-23 DIAGNOSIS — M79605 Pain in left leg: Secondary | ICD-10-CM | POA: Diagnosis not present

## 2015-12-23 DIAGNOSIS — M79604 Pain in right leg: Secondary | ICD-10-CM | POA: Diagnosis not present

## 2015-12-23 DIAGNOSIS — Z79899 Other long term (current) drug therapy: Secondary | ICD-10-CM | POA: Diagnosis not present

## 2015-12-23 DIAGNOSIS — M8588 Other specified disorders of bone density and structure, other site: Secondary | ICD-10-CM | POA: Diagnosis not present

## 2015-12-31 DIAGNOSIS — Z1211 Encounter for screening for malignant neoplasm of colon: Secondary | ICD-10-CM | POA: Diagnosis not present

## 2016-01-02 DIAGNOSIS — Z0100 Encounter for examination of eyes and vision without abnormal findings: Secondary | ICD-10-CM | POA: Diagnosis not present

## 2016-01-05 DIAGNOSIS — M5442 Lumbago with sciatica, left side: Secondary | ICD-10-CM | POA: Diagnosis not present

## 2016-01-13 DIAGNOSIS — Z1211 Encounter for screening for malignant neoplasm of colon: Secondary | ICD-10-CM | POA: Diagnosis not present

## 2016-01-20 DIAGNOSIS — M545 Low back pain: Secondary | ICD-10-CM | POA: Diagnosis not present

## 2016-01-23 DIAGNOSIS — M5442 Lumbago with sciatica, left side: Secondary | ICD-10-CM | POA: Diagnosis not present

## 2016-03-25 DIAGNOSIS — E7800 Pure hypercholesterolemia, unspecified: Secondary | ICD-10-CM | POA: Diagnosis not present

## 2016-04-19 ENCOUNTER — Other Ambulatory Visit: Payer: Self-pay | Admitting: Family Medicine

## 2016-04-19 DIAGNOSIS — Z1231 Encounter for screening mammogram for malignant neoplasm of breast: Secondary | ICD-10-CM

## 2016-04-21 DIAGNOSIS — Z124 Encounter for screening for malignant neoplasm of cervix: Secondary | ICD-10-CM | POA: Diagnosis not present

## 2016-05-14 ENCOUNTER — Ambulatory Visit
Admission: RE | Admit: 2016-05-14 | Discharge: 2016-05-14 | Disposition: A | Discharge status: Home / Self Care | Payer: Commercial Managed Care - HMO | Source: Ambulatory Visit | Attending: Family Medicine | Admitting: Family Medicine

## 2016-05-14 DIAGNOSIS — Z1231 Encounter for screening mammogram for malignant neoplasm of breast: Secondary | ICD-10-CM

## 2016-06-11 DIAGNOSIS — J209 Acute bronchitis, unspecified: Secondary | ICD-10-CM | POA: Diagnosis not present

## 2016-06-18 DIAGNOSIS — M25562 Pain in left knee: Secondary | ICD-10-CM | POA: Diagnosis not present

## 2016-06-25 ENCOUNTER — Encounter (HOSPITAL_COMMUNITY): Payer: Self-pay | Admitting: *Deleted

## 2016-06-25 ENCOUNTER — Inpatient Hospital Stay (HOSPITAL_COMMUNITY)
Admission: AD | Admit: 2016-06-25 | Discharge: 2016-06-25 | Disposition: A | Discharge status: Home / Self Care | Payer: Commercial Managed Care - HMO | Source: Ambulatory Visit | Attending: Obstetrics & Gynecology | Admitting: Obstetrics & Gynecology

## 2016-06-25 DIAGNOSIS — R51 Headache: Secondary | ICD-10-CM | POA: Diagnosis not present

## 2016-06-25 DIAGNOSIS — M25562 Pain in left knee: Secondary | ICD-10-CM | POA: Diagnosis not present

## 2016-06-25 DIAGNOSIS — G44201 Tension-type headache, unspecified, intractable: Secondary | ICD-10-CM | POA: Diagnosis not present

## 2016-06-25 DIAGNOSIS — M1712 Unilateral primary osteoarthritis, left knee: Secondary | ICD-10-CM | POA: Diagnosis not present

## 2016-06-25 HISTORY — DX: Pain in unspecified knee: M25.569

## 2016-06-25 MED ORDER — KETOROLAC TROMETHAMINE 60 MG/2ML IM SOLN
60.0000 mg | Freq: Once | INTRAMUSCULAR | Status: AC
  Administered 2016-06-25: 60 mg via INTRAMUSCULAR
  Filled 2016-06-25: qty 2

## 2016-06-25 NOTE — Discharge Instructions (Signed)
General Headache Without Cause A headache is pain or discomfort felt around the head or neck area. There are many causes and types of headaches. In some cases, the cause may not be found.  HOME CARE  Managing Pain  Take over-the-counter and prescription medicines only as told by your doctor.  Lie down in a dark, quiet room when you have a headache.  If directed, apply ice to the head and neck area:  Put ice in a plastic bag.  Place a towel between your skin and the bag.  Leave the ice on for 20 minutes, 2-3 times per day.  Use a heating pad or hot shower to apply heat to the head and neck area as told by your doctor.  Keep lights dim if bright lights bother you or make your headaches worse. Eating and Drinking  Eat meals on a regular schedule.  Lessen how much alcohol you drink.  Lessen how much caffeine you drink, or stop drinking caffeine. General Instructions  Keep all follow-up visits as told by your doctor. This is important.  Keep a journal to find out if certain things bring on headaches. For example, write down:  What you eat and drink.  How much sleep you get.  Any change to your diet or medicines.  Relax by getting a massage or doing other relaxing activities.  Lessen stress.  Sit up straight. Do not tighten (tense) your muscles.  Do not use tobacco products. This includes cigarettes, chewing tobacco, or e-cigarettes. If you need help quitting, ask your doctor.  Exercise regularly as told by your doctor.  Get enough sleep. This often means 7-9 hours of sleep. GET HELP IF:  Your symptoms are not helped by medicine.  You have a headache that feels different than the other headaches.  You feel sick to your stomach (nauseous) or you throw up (vomit).  You have a fever. GET HELP RIGHT AWAY IF:   Your headache becomes really bad.  You keep throwing up.  You have a stiff neck.  You have trouble seeing.  You have trouble speaking.  You have  pain in the eye or ear.  Your muscles are weak or you lose muscle control.  You lose your balance or have trouble walking.  You feel like you will pass out (faint) or you pass out.  You have confusion.   This information is not intended to replace advice given to you by your health care provider. Make sure you discuss any questions you have with your health care provider.   Document Released: 05/18/2008 Document Revised: 04/30/2015 Document Reviewed: 12/02/2014 Elsevier Interactive Patient Education 2016 Elsevier Inc[employer]   Tension Headache A tension headache is pain, pressure, or aching that is felt over the front and sides of your head. These headaches can last from 30 minutes to several days. HOME CARE Managing Pain  Take over-the-counter and prescription medicines only as told by your doctor.  Lie down in a dark, quiet room when you have a headache.  If directed, apply ice to your head and neck area:  Put ice in a plastic bag.  Place a towel between your skin and the bag.  Leave the ice on for 20 minutes, 2-3 times per day.  Use a heating pad or a hot shower to apply heat to your head and neck area as told by your doctor. Eating and Drinking  Eat meals on a regular schedule.  Do not drink a lot of alcohol.  Do not  use a lot of caffeine, or stop using caffeine. General Instructions  Keep all follow-up visits as told by your doctor. This is important.  Keep a journal to find out if certain things bring on headaches. For example, write down:  What you eat and drink.  How much sleep you get.  Any change to your diet or medicines.  Try getting a massage, or doing other things that help you to relax.  Lessen stress.  Sit up straight. Do not tighten (tense) your muscles.  Do not use tobacco products. This includes cigarettes, chewing tobacco, or e-cigarettes. If you need help quitting, ask your doctor.  Exercise regularly as told by your doctor.  Get enough  sleep. This may mean 7-9 hours of sleep. GET HELP IF:  Your symptoms are not helped by medicine.  You have a headache that feels different from your usual headache.  You feel sick to your stomach (nauseous) or you throw up (vomit).  You have a fever. GET HELP RIGHT AWAY IF:  Your headache becomes very bad.  You keep throwing up.  You have a stiff neck.  You have trouble seeing.  You have trouble speaking.  You have pain in your eye or ear.  Your muscles are weak or you lose muscle control.  You lose your balance or you have trouble walking.  You feel like you will pass out (faint) or you pass out.  You have confusion.   This information is not intended to replace advice given to you by your health care provider. Make sure you discuss any questions you have with your health care provider.   Document Released: 11/03/2009 Document Revised: 04/30/2015 Document Reviewed: 12/02/2014 Elsevier Interactive Patient Education [employer].

## 2016-06-25 NOTE — MAU Note (Signed)
Pt reports a headache, states the pain is mostly near her ears and down into her jaw.

## 2016-06-25 NOTE — MAU Provider Note (Signed)
History     CSN: [id number]  Arrival date and time: 06/25/16 [id number]   First Provider Initiated Contact with Patient 06/25/16 0720      Chief Complaint  Patient presents with  . Headache   Headache   This is a new problem. The current episode started in the past 7 days. The problem occurs constantly. The problem has been unchanged. The pain is located in the bilateral and parietal region. The pain radiates to the face, right neck and left neck. The pain quality is similar to prior headaches. The quality of the pain is described as aching. The pain is at a severity of 10/10. Associated symptoms include dizziness. Pertinent negatives include no fever, nausea, tingling or vomiting. Nothing aggravates the symptoms. She has tried NSAIDs for the symptoms. The treatment provided mild relief.    Past Medical History:  Diagnosis Date  . Hypercholesteremia   . Knee pain     Past Surgical History:  Procedure Laterality Date  . ABDOMINAL HYSTERECTOMY    . TUBAL LIGATION      History reviewed. No pertinent family history.  Social History  Substance Use Topics  . Smoking status: Never Smoker  . Smokeless tobacco: Never Used  . Alcohol use Yes     Comment: occasional glass of wine    Allergies: No Known Allergies  Prescriptions Prior to Admission  Medication Sig Dispense Refill Last Dose  . naproxen sodium (ANAPROX) 220 MG tablet Take 220 mg by mouth 2 (two) times daily with a meal.   06/25/2016 at Unknown time  . diazepam (VALIUM) 2 MG tablet Take 1 tablet (2 mg total) by mouth 2 (two) times daily. 6 tablet 0 Taking  . HYDROcodone-acetaminophen (NORCO/VICODIN) 5-325 MG per tablet Take 2 tablets by mouth every 4 (four) hours as needed. (Patient not taking: Reported on 12/10/2014) 10 tablet 0 Not Taking  . methocarbamol (ROBAXIN) 500 MG tablet Take 1 tablet (500 mg total) by mouth 2 (two) times daily. (Patient not taking: Reported on 12/10/2014) 20 tablet 0 Not Taking  . simvastatin (ZOCOR)  20 MG tablet Take 40 mg by mouth daily.    Taking    Review of Systems  Constitutional: Negative for chills and fever.  Respiratory: Negative for shortness of breath.   Cardiovascular: Negative for chest pain.  Gastrointestinal: Negative for nausea and vomiting.  Neurological: Positive for dizziness and headaches. Negative for tingling.   Physical Exam   Blood pressure 115/70, pulse 72, temperature 98.9 F (37.2 C), temperature source Oral, resp. rate 19, SpO2 97 %.  Physical Exam  Nursing note and vitals reviewed. Constitutional: She is oriented to person, place, and time. She appears well-developed and well-nourished. No distress.  HENT:  Head: Normocephalic.  Eyes: EOM are normal.  Cardiovascular: Normal rate.   Respiratory: Effort normal.  Neurological: She is alert and oriented to person, place, and time. No cranial nerve deficit. Coordination normal.  Skin: Skin is warm and dry.  Psychiatric: She has a normal mood and affect.    MAU Course  Procedures  MDM Offered patient transfer to [facility] for evaluation or treatment here with toradol. Patient would like to try toradol here. If not better will consider going to [facility].   0800 Care turned over to Dr. Jennye Moccasin, Tamala Bari 7:51 AM 06/25/16   8:59 AM - Reevaluated the patient, her headache is completely gone, she would like to go home. Discussed signs/symptoms of stroke or aneurysm. Recommended to get a PCP in case  headaches become recurrent. Patient verbalized understanding. OK for discharge home. Reevaluated patient and agree with above physical exam findings.   Assessment and Plan   1) Headache - Resolved with Toradol     Medication List    STOP taking these medications   diazepam 2 MG tablet Commonly known as:  VALIUM   HYDROcodone-acetaminophen 5-325 MG tablet Commonly known as:  NORCO/VICODIN   methocarbamol 500 MG tablet Commonly known as:  ROBAXIN     TAKE these medications   meloxicam 15  MG tablet Commonly known as:  MOBIC Take 15 mg by mouth daily.   naproxen sodium 220 MG tablet Commonly known as:  ANAPROX Take 220 mg by mouth 2 (two) times daily with a meal.

## 2016-06-27 ENCOUNTER — Encounter (HOSPITAL_COMMUNITY): Payer: Self-pay | Admitting: *Deleted

## 2016-06-27 ENCOUNTER — Emergency Department (HOSPITAL_COMMUNITY): Payer: Commercial Managed Care - HMO

## 2016-06-27 ENCOUNTER — Emergency Department (HOSPITAL_COMMUNITY)
Admission: EM | Admit: 2016-06-27 | Discharge: 2016-06-27 | Disposition: A | Discharge status: Home / Self Care | Payer: Commercial Managed Care - HMO | Attending: Emergency Medicine | Admitting: Emergency Medicine

## 2016-06-27 DIAGNOSIS — Z7982 Long term (current) use of aspirin: Secondary | ICD-10-CM | POA: Insufficient documentation

## 2016-06-27 DIAGNOSIS — R51 Headache: Secondary | ICD-10-CM | POA: Diagnosis not present

## 2016-06-27 DIAGNOSIS — R519 Headache, unspecified: Secondary | ICD-10-CM

## 2016-06-27 DIAGNOSIS — Z79899 Other long term (current) drug therapy: Secondary | ICD-10-CM | POA: Diagnosis not present

## 2016-06-27 MED ORDER — DIPHENHYDRAMINE HCL 50 MG/ML IJ SOLN
12.5000 mg | Freq: Once | INTRAMUSCULAR | Status: AC
  Administered 2016-06-27: 12.5 mg via INTRAVENOUS
  Filled 2016-06-27: qty 1

## 2016-06-27 MED ORDER — DEXAMETHASONE SODIUM PHOSPHATE 10 MG/ML IJ SOLN
10.0000 mg | Freq: Once | INTRAMUSCULAR | Status: AC
  Administered 2016-06-27: 10 mg via INTRAVENOUS
  Filled 2016-06-27: qty 1

## 2016-06-27 MED ORDER — SODIUM CHLORIDE 0.9 % IV BOLUS (SEPSIS)
500.0000 mL | Freq: Once | INTRAVENOUS | Status: AC
  Administered 2016-06-27: 500 mL via INTRAVENOUS

## 2016-06-27 MED ORDER — ONDANSETRON HCL 4 MG/2ML IJ SOLN
4.0000 mg | Freq: Once | INTRAMUSCULAR | Status: AC
  Administered 2016-06-27: 4 mg via INTRAVENOUS
  Filled 2016-06-27: qty 2

## 2016-06-27 MED ORDER — KETOROLAC TROMETHAMINE 30 MG/ML IJ SOLN
15.0000 mg | Freq: Once | INTRAMUSCULAR | Status: AC
  Administered 2016-06-27: 15 mg via INTRAVENOUS
  Filled 2016-06-27: qty 1

## 2016-06-27 NOTE — ED Notes (Signed)
Warm blanket given

## 2016-06-27 NOTE — Discharge Instructions (Signed)
Read the information below.  Your CT scan was re-assuring. Your headache resolved with treatment in the ED.  You can take Excedrin migraine or Motrin 400mg  every 6hrs as needed for pain relief.  Be sure to keep your scheduled appointment with your primary doctor tomorrow and discussed the new onset of headaches.  You may return to the Emergency Department at any time for worsening condition or any new symptoms that concern you. Return to ED if develop fever, uncontrolled pain, changes in vision, facial droop, slurred speech, numbness/weakness or any other new/concerning symptoms.

## 2016-06-27 NOTE — ED Notes (Signed)
No respiratory or acute distress noted alert and oriented x 3 call light in reach visitor at bedside. 

## 2016-06-27 NOTE — ED Triage Notes (Signed)
Pt states that she began having a headache on Wed; pt denies N/V, photosensitivity , or sound sensitivity; pt was seen at [facility] on Fri and was given a shot and states that the headache was gone until Sat evening; pt states that the headache has returned and has not gotten better with home treatment; pt reports taking Excedrin Migraine around 6pm with no improvement

## 2016-06-27 NOTE — ED Notes (Signed)
Clear speech noted steady gait noted moves all extremities.

## 2016-06-27 NOTE — ED Provider Notes (Signed)
[facility] DEPT Provider Note   CSN: [id number] Arrival date & time: 06/27/16  1850   By signing my name below, I, Neta Mends, attest that this documentation has been prepared under the direction and in the presence of Gay Filler, PA-C. Electronically Signed: Neta Mends, ED Scribe. 06/27/2016. 8:46 PM.   History   Chief Complaint Chief Complaint  Patient presents with  . Headache    The history is provided by the patient. No language interpreter was used.   HPI Comments:  Caroline Wilson is a 70 y.o. female who presents to the Emergency Department complaining of a constant headache x 5 days. Headache was gradual in onset. At the onset, she took Aleve with minor, temporary relief, and then went to the ED at [facility] 2 days ago and had a shot of toradol that provided relief for 36 hours. Then the headache returned last night and has remained constant. Pt states that the pain is primarily in her forehead with radiation into the top of her head and temples. Pt reports associated dizziness 2 days ago that has since resolved. Pt describes the pain as "throbbing." Pt notes that she recently had bronchitis with a cough that is still present. Pt took Excedrin Migraine ~3 hours ago with no relief. Pt denies history of previous similar headaches of this duration, denies PMHx of DVT/PE, and denies head injury. Pt denies fever, sensitivity to light/sound, nausea, vomiting, rash, facial droop, slurred speech, weakness, numbness, visual disturbance, dysuria, hematuria.   Past Medical History:  Diagnosis Date  . Hypercholesteremia   . Knee pain     There are no active problems to display for this patient.   Past Surgical History:  Procedure Laterality Date  . ABDOMINAL HYSTERECTOMY    . TUBAL LIGATION      OB History    Gravida Para Term Preterm AB Living   3 3     0 3   SAB TAB Ectopic Multiple Live Births   0 0 0 0 3       Home Medications    Prior to  Admission medications   Medication Sig Start Date End Date Taking? Authorizing Provider  aspirin-acetaminophen-caffeine (EXCEDRIN MIGRAINE) [phone] MG tablet Take 1 tablet by mouth every 6 (six) hours as needed for headache.   Yes Historical Provider, MD  meloxicam (MOBIC) 15 MG tablet Take 15 mg by mouth daily.   Yes Historical Provider, MD  naproxen sodium (ANAPROX) 220 MG tablet Take 220 mg by mouth 2 (two) times daily with a meal.   Yes Historical Provider, MD  simvastatin (ZOCOR) 40 MG tablet Take 40 mg by mouth daily.   Yes Historical Provider, MD    Family History No family history on file.  Social History Social History  Substance Use Topics  . Smoking status: Never Smoker  . Smokeless tobacco: Never Used  . Alcohol use Yes     Comment: occasional glass of wine     Allergies   Patient has no known allergies.   Review of Systems Review of Systems  Constitutional: Negative for fever.  HENT: Negative for trouble swallowing.   Eyes: Negative for photophobia and visual disturbance.  Respiratory: Negative for shortness of breath.   Cardiovascular: Negative for chest pain.  Gastrointestinal: Negative for nausea and vomiting.  Genitourinary: Negative for dysuria and hematuria.  Musculoskeletal: Positive for neck pain (right sided, muscle).  Skin: Negative for rash.  Neurological: Positive for dizziness ( since resolved) and headaches. Negative  for facial asymmetry, speech difficulty, weakness, light-headedness and numbness.     Physical Exam Updated Vital Signs BP 138/73 (BP Location: Left Arm)   Pulse 92   Temp 99.8 F (37.7 C)   Resp 18   Ht 5\' 3"  (1.6 m)   Wt 79.4 kg   SpO2 100%   BMI 31.00 kg/m   Physical Exam  Constitutional: She appears well-developed and well-nourished. No distress.  HENT:  Head: Normocephalic and atraumatic.  Mouth/Throat: Oropharynx is clear and moist. No oropharyngeal exudate.  Eyes: Conjunctivae and EOM are normal. Pupils are  equal, round, and reactive to light. Right eye exhibits no discharge. Left eye exhibits no discharge. No scleral icterus.  Neck: Normal range of motion. Neck supple.  Cardiovascular: Normal rate, regular rhythm, normal heart sounds and intact distal pulses.   No murmur heard. Pulmonary/Chest: Effort normal and breath sounds normal. No respiratory distress. She has no wheezes. She has no rales. She exhibits no tenderness.  Abdominal: Soft. She exhibits no distension and no mass. There is no tenderness. There is no rebound and no guarding. No hernia.  Musculoskeletal: She exhibits no edema.  Neurological: She is alert. She is not disoriented. She exhibits normal muscle tone. Coordination and gait normal. GCS eye subscore is 4. GCS verbal subscore is 5. GCS motor subscore is 6.  Mental Status:  Alert, thought content appropriate, able to give a coherent history. Speech fluent without evidence of aphasia. Able to follow 2 step commands without difficulty.  Cranial Nerves:  II:  Peripheral visual fields grossly normal, pupils equal, round, reactive to light III,IV, VI: ptosis not present, extra-ocular motions intact bilaterally  V,VII: smile symmetric, facial light touch sensation equal VIII: hearing grossly normal to voice  X: uvula elevates symmetrically  XI: bilateral shoulder shrug symmetric and strong XII: midline tongue extension without fassiculations Motor:  Normal tone. 5/5 in upper and lower extremities bilaterally including strong and equal grip strength and dorsiflexion/plantar flexion Sensory: light touch normal in all extremities. Cerebellar: normal finger-to-nose with bilateral upper extremities Gait: normal gait and balance CV: distal pulses palpable throughout   Skin: Skin is warm and dry.  Psychiatric: She has a normal mood and affect.  Nursing note and vitals reviewed.    ED Treatments / Results  DIAGNOSTIC STUDIES:  Oxygen Saturation is 96% on RA, normal by my  interpretation.    COORDINATION OF CARE:  8:46 PM Discussed treatment plan with pt at bedside and pt agreed to plan.   Labs (all labs ordered are listed, but only abnormal results are displayed) Labs Reviewed - No data to display  EKG  EKG Interpretation None       Radiology Ct Head Wo Contrast  Result Date: 06/27/2016 CLINICAL DATA:  Severe headache for 5 days. EXAM: CT HEAD WITHOUT CONTRAST TECHNIQUE: Contiguous axial images were obtained from the base of the skull through the vertex without intravenous contrast. COMPARISON:  None. FINDINGS: Brain: No evidence of acute infarction, hemorrhage, hydrocephalus, extra-axial collection or mass lesion/mass effect. Vascular: No hyperdense vessel or unexpected calcification. Skull: Normal. Negative for fracture or focal lesion. Sinuses/Orbits: No acute finding. Other: None. IMPRESSION: Negative unenhanced head CT. Electronically Signed   By: Earle Gell M.D.   On: 06/27/2016 21:41    Procedures Procedures (including critical care time)  Medications Ordered in ED Medications  sodium chloride 0.9 % bolus 500 mL (0 mLs Intravenous Stopped 06/27/16 2149)  diphenhydrAMINE (BENADRYL) injection 12.5 mg (12.5 mg Intravenous Given 06/27/16 2113)  ondansetron [facility]) injection 4 mg (4 mg Intravenous Given 06/27/16 2112)  ketorolac (TORADOL) 30 MG/ML injection 15 mg (15 mg Intravenous Given 06/27/16 2113)     Initial Impression / Assessment and Plan / ED Course  I have reviewed the triage vital signs and the nursing notes.  Pertinent labs & imaging results that were available during my care of the patient were reviewed by me and considered in my medical decision making (see chart for details).  Clinical Course as of Jun 27 2240  Nancy Fetter Jun 27, 2016  2200 Reviewed CT Head Wo Contrast [AM]  2237 On re-evaluation patient endorses resolution of headache.   [AM]    Clinical Course User Index [AM] Roxanna Mew, PA-C    Patient presents to  ED with complaint of headache x 5 days. Patient is afebrile and non-toxic appearing in NAD. VSS. No focal neuro deficits on exam. Patient ambulates with steady gait. Given age and new onset of HA will CT head. Migraine cocktail ordered.   CT head normal - no evidence of hemorrhage, infarct, or mass. Doubt meningitis - afebrile and no nuchal rigidity. Doubt temporal arteritis - no changes in vision. On re-evaluation patient endorses resolution of headache. Discussed results and plan with patient. Will given decadron to prevent rebound HA. Patient has a follow up appointment with her PCP tomorrow. Encouraged patient to talk with PCP regarding new onset HA. Return precautions given. Patient voiced understanding and is agreeable.   Discussed patient with Dr. Alvino Chapel, agrees with plan.    Final Clinical Impressions(s) / ED Diagnoses   Final diagnoses:  None    New Prescriptions New Prescriptions   No medications on file  I personally performed the services described in this documentation, which was scribed in my presence. The recorded information has been reviewed and is accurate.     Roxanna Mew, PA-C 06/27/16 2317    Davonna Belling, MD 06/28/16 0110

## 2016-06-28 DIAGNOSIS — E7800 Pure hypercholesterolemia, unspecified: Secondary | ICD-10-CM | POA: Diagnosis not present

## 2016-06-28 DIAGNOSIS — R51 Headache: Secondary | ICD-10-CM | POA: Diagnosis not present

## 2016-06-28 DIAGNOSIS — D72819 Decreased white blood cell count, unspecified: Secondary | ICD-10-CM | POA: Diagnosis not present

## 2016-06-28 DIAGNOSIS — Z23 Encounter for immunization: Secondary | ICD-10-CM | POA: Diagnosis not present

## 2016-07-05 ENCOUNTER — Ambulatory Visit (INDEPENDENT_AMBULATORY_CARE_PROVIDER_SITE_OTHER): Payer: Commercial Managed Care - HMO | Admitting: Neurology

## 2016-07-05 ENCOUNTER — Encounter: Payer: Self-pay | Admitting: Neurology

## 2016-07-05 DIAGNOSIS — R519 Headache, unspecified: Secondary | ICD-10-CM | POA: Insufficient documentation

## 2016-07-05 DIAGNOSIS — R51 Headache: Secondary | ICD-10-CM | POA: Diagnosis not present

## 2016-07-05 MED ORDER — PROPRANOLOL HCL 20 MG PO TABS: 20.0000 mg | ORAL_TABLET | Freq: Two times a day (BID) | ORAL | 5 refills | Status: DC

## 2016-07-05 MED ORDER — KETOROLAC TROMETHAMINE 10 MG PO TABS: 10.0000 mg | ORAL_TABLET | Freq: Four times a day (QID) | ORAL | 6 refills | Status: DC | PRN

## 2016-07-05 MED ORDER — SUMATRIPTAN SUCCINATE 25 MG PO TABS: 25.0000 mg | ORAL_TABLET | ORAL | 0 refills | Status: DC | PRN

## 2016-07-05 NOTE — Progress Notes (Addendum)
PATIENT: Caroline Wilson DOB: 1946/04/09  Chief Complaint  Patient presents with  . Headache    She is here with her husband, Caroline Wilson.  She developed a severe headache on 06/23/16.  The pain has improved but not resolved completely.  She completed a six-day course of Prednisone today.  She has been treated in the ED twice.  She has a normal CT scan.  Reports intermittent blurred vision with some of her headaches.  Marland Kitchen PCP    Leighton Ruff, MD     HISTORICAL  Caroline Wilson is a 70 year old right-handed female, accompanied by her husband, seen in refer by her primary care doctor Leighton Ruff for evaluation of severe headaches, initial evaluation was on July 05 2016.  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  She has chronic neck, shoulde pain, but denied previous history of headache, on June 23 2016, she woke up felt holo- cranial, severe headache, but there was no associated light noise sensitivity, she also feel pounding, radiating pain wrapped around herears and jaws, she has been taking frequent Aleve, Tylenol, without helping,  She presented to the emergency room on November 3, again June 27 2016, and personally reviewed CAT scan without contrast June 27 2016, no acute abnormality, she was given Zofran, Toradol, which has helped her headache,  Over the past few weeks, she had recurrent headaches again, especially with movement, she felt 5/10 pressure headache, more to the left side frontal region, retro-orbital, she was given the prescription of tapering dose, prednisone a week course, which did help her, but after finished prednisone tapering, she had recurrent headaches again.  She denies visual loss, she does complains of aching muscles, Reviewed laboratory evaluations, cholesterol 156, LDL 104, normal CMP, with creatinine 0.81  REVIEW OF SYSTEMS: Full 14 system review  of systems performed and notable only for achy muscles, headache, weakness, change in appetite, constipation, cough  ALLERGIES: No Known Allergies  HOME MEDICATIONS: Current Outpatient Prescriptions  Medication Sig Dispense Refill  . simvastatin (ZOCOR) 40 MG tablet Take 40 mg by mouth daily.     No current facility-administered medications for this visit.     PAST MEDICAL HISTORY: Past Medical History:  Diagnosis Date  . Headache   . Hypercholesteremia   . Knee pain     PAST SURGICAL HISTORY: Past Surgical History:  Procedure Laterality Date  . ABDOMINAL HYSTERECTOMY    . TUBAL LIGATION      FAMILY HISTORY: Family History  Problem Relation Age of Onset  . Heart disease Mother   . Hypertension Father     SOCIAL HISTORY:  Social History   Social History  . Marital status: Married    Spouse name: N/A  . Number of children: 3  . Years of education: 2 years college   Occupational History  . 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 Smoking status: Never Smoker  . Smokeless tobacco: Never Used  . Alcohol use Yes     Comment: occasional glass of wine  . Drug use: No  . Sexual activity: Not on file   Other Topics Concern  . Not on file   Social History Narrative   Lives at home with husband.   Right-handed.   3 cups caffeine per day.     PHYSICAL EXAM   Vitals:   07/05/16 1232  BP: 128/81  Pulse: 67  Weight: 167  lb 12 oz (76.1 kg)  Height: '5\' 3"'  (1.6 m)    Not recorded      Body mass index is 29.72 kg/m.  PHYSICAL EXAMNIATION:  Gen: NAD, conversant, well nourised, obese, well groomed                     Cardiovascular: Regular rate rhythm, no peripheral edema, warm, nontender. Eyes: Conjunctivae clear without exudates or hemorrhage Neck: Supple, no carotid bruits. Pulmonary: Clear to auscultation bilaterally   NEUROLOGICAL EXAM:  MENTAL STATUS: Speech:    Speech is normal; fluent and spontaneous with  normal comprehension.  Cognition:     Orientation to time, place and person     Normal recent and remote memory     Normal Attention span and concentration     Normal Language, naming, repeating,spontaneous speech     Fund of knowledge   CRANIAL NERVES: CN II: Visual fields are full to confrontation. Fundoscopic exam is normal with sharp discs and no vascular changes. Pupils are round equal and briskly reactive to light. CN III, IV, VI: extraocular movement are normal. No ptosis. CN V: Facial sensation is intact to pinprick in all 3 divisions bilaterally. Corneal responses are intact.  CN VII: Face is symmetric with normal eye closure and smile. CN VIII: Hearing is normal to rubbing fingers CN IX, X: Palate elevates symmetrically. Phonation is normal. CN XI: Head turning and shoulder shrug are intact CN XII: Tongue is midline with normal movements and no atrophy.  MOTOR: There is no pronator drift of out-stretched arms. Muscle bulk and tone are normal. Muscle strength is normal.  REFLEXES: Reflexes are 2+ and symmetric at the biceps, triceps, knees, and ankles. Plantar responses are flexor.  SENSORY: Intact to light touch, pinprick, positional sensation and vibratory sensation are intact in fingers and toes.  COORDINATION: Rapid alternating movements and fine finger movements are intact. There is no dysmetria on finger-to-nose and heel-knee-shin.    GAIT/STANCE: Posture is normal. Gait is steady with normal steps, base, arm swing, and turning. Heel and toe walking are normal. Tandem gait is normal.  Romberg is absent.   DIAGNOSTIC DATA (LABS, IMAGING, TESTING) - I reviewed patient records, labs, notes, testing and imaging myself where available.   ASSESSMENT AND PLAN  Caroline Wilson is a 70 y.o. female    New-onset headaches elderly  ESR C-reactive protein to rule out temporal arteritis Her headache has migraine features  Imitrex 28m prn  Propanolol 20 mg twice a day as  preventive medications  YMarcial Pacas M.D. Ph.D.  [facility] [address] [place] [address]Ph: [phone]: [phone] COO:[id number] Dallas MD

## 2016-07-06 ENCOUNTER — Other Ambulatory Visit: Payer: Self-pay | Admitting: *Deleted

## 2016-07-06 ENCOUNTER — Telehealth: Payer: Self-pay | Admitting: Neurology

## 2016-07-06 LAB — CK: CK TOTAL: 35 U/L (ref 24–173)

## 2016-07-06 LAB — C-REACTIVE PROTEIN: CRP: 11.5 mg/L — ABNORMAL HIGH (ref 0.0–4.9)

## 2016-07-06 LAB — VITAMIN B12: VITAMIN B 12: 726 pg/mL (ref 211–946)

## 2016-07-06 LAB — ANA W/REFLEX: ANA: NEGATIVE

## 2016-07-06 LAB — TSH: TSH: 1.08 u[IU]/mL (ref 0.450–4.500)

## 2016-07-06 LAB — SEDIMENTATION RATE: Sed Rate: 44 mm/hr — ABNORMAL HIGH (ref 0–40)

## 2016-07-06 MED ORDER — NAPROXEN 500 MG PO TABS: 500.0000 mg | ORAL_TABLET | Freq: Four times a day (QID) | ORAL | 6 refills | Status: AC | PRN

## 2016-07-06 NOTE — Telephone Encounter (Signed)
Spoke to patient - the medications have been mildly helpful.  She is still having daily headaches.  She will come in on Wed, 07/07/16, for repeat labs.  She is aware that we will call back to advise her on treatment.

## 2016-07-06 NOTE — Telephone Encounter (Signed)
Please call and check on her headache, laboratory evaluation showed mild elevated ESR, C-reactive protein,  She was treated with a week course of tapering prednisone, she did have improvement, possibility including temporal arteritis  I have entered repeat ESR C-reactive protein, she may come back for repeat laboratory, if she continue have significant headache, may consider a low maintenance dose of prednisone

## 2016-07-07 ENCOUNTER — Encounter: Payer: Self-pay | Admitting: Neurology

## 2016-07-07 ENCOUNTER — Other Ambulatory Visit (INDEPENDENT_AMBULATORY_CARE_PROVIDER_SITE_OTHER): Payer: Self-pay

## 2016-07-07 ENCOUNTER — Other Ambulatory Visit: Payer: Self-pay | Admitting: *Deleted

## 2016-07-07 DIAGNOSIS — R899 Unspecified abnormal finding in specimens from other organs, systems and tissues: Secondary | ICD-10-CM

## 2016-07-07 DIAGNOSIS — G4489 Other headache syndrome: Secondary | ICD-10-CM | POA: Diagnosis not present

## 2016-07-07 DIAGNOSIS — Z0289 Encounter for other administrative examinations: Secondary | ICD-10-CM

## 2016-07-08 ENCOUNTER — Other Ambulatory Visit: Payer: Self-pay | Admitting: *Deleted

## 2016-07-08 ENCOUNTER — Telehealth: Payer: Self-pay | Admitting: Neurology

## 2016-07-08 LAB — SEDIMENTATION RATE: SED RATE: 34 mm/h (ref 0–40)

## 2016-07-08 LAB — C-REACTIVE PROTEIN: CRP: 14.7 mg/L — AB (ref 0.0–4.9)

## 2016-07-08 MED ORDER — PREDNISONE 10 MG PO TABS: ORAL_TABLET | ORAL | 0 refills | Status: DC

## 2016-07-08 NOTE — Telephone Encounter (Signed)
I failed to reach her left message for her, laboratory evaluation showed normal ESR, elevated C reactive protein 14.  Please check on her again, if she continue have significant headache, may consider low-dose steroid treatment,  Also asked to see if she has any signs of deep muscle achy pain, jaw claudication, chewing difficulty.  if she responded well to propanolol, and Imitrex for headaches, will just keep current treatments

## 2016-07-08 NOTE — Telephone Encounter (Signed)
Spoke to patient on 07/06/16 - she was still having daily headaches and the medications were only mildly helpful.  Per Dr. Krista Blue, provide rx for prednisone 10mg , 2 tabs x 1 week, then 1 tab daily thereafter until her follow up appt on 09/08/15.  Called patient - unable to reach - left detailed message (ok per DPR).  She is aware that the prescription has been sent to the pharmacy.  I invited her to call back with any questions or concerns.

## 2016-07-09 ENCOUNTER — Encounter: Payer: Self-pay | Admitting: Neurology

## 2016-07-09 ENCOUNTER — Telehealth: Payer: Self-pay | Admitting: Neurology

## 2016-07-09 NOTE — Telephone Encounter (Signed)
I called phamarcy to clarify the order. Naproxen 500mg  prn, only rarely toradol 10mg  prn, she only has 20 tabs for Nov

## 2016-07-09 NOTE — Telephone Encounter (Signed)
Please relay the message, I have started prednisone,

## 2016-07-09 NOTE — Telephone Encounter (Signed)
I have spoken with Caroline Wilson this morning and reviewed Prednisone rx. with her--she should take 2 tabs daily for one week, then one tab daily after that, until seen by YY at f/u appt. in Jan.  She may continue her other meds/fim

## 2016-07-09 NOTE — Telephone Encounter (Signed)
Patient returned Caroline Wilson's call, did get voicemail message, has questions about medication, also states, she continues to have headaches. Requests call back.

## 2016-07-22 ENCOUNTER — Ambulatory Visit: Payer: Self-pay | Admitting: Neurology

## 2016-08-23 HISTORY — PX: BREAST LUMPECTOMY: SHX2

## 2016-09-07 ENCOUNTER — Encounter: Payer: Self-pay | Admitting: Neurology

## 2016-09-07 ENCOUNTER — Ambulatory Visit (INDEPENDENT_AMBULATORY_CARE_PROVIDER_SITE_OTHER): Payer: Medicare HMO | Admitting: Neurology

## 2016-09-07 VITALS — BP 131/72 | HR 60 | Ht 63.0 in | Wt 177.0 lb

## 2016-09-07 DIAGNOSIS — R899 Unspecified abnormal finding in specimens from other organs, systems and tissues: Secondary | ICD-10-CM

## 2016-09-07 DIAGNOSIS — R51 Headache: Secondary | ICD-10-CM

## 2016-09-07 DIAGNOSIS — R519 Headache, unspecified: Secondary | ICD-10-CM

## 2016-09-07 NOTE — Progress Notes (Signed)
PATIENT: Caroline Wilson DOB: 1945/10/07  Chief Complaint  Patient presents with  . Headache    She is here with her husband, Richard.  Overall, her headaches have improved.  She estimates having two mild headaches weekly.  These resolve easily with her home medications.  They would like to discuss her lab results.     HISTORICAL  Caroline Wilson is a 71 year old right-handed female, accompanied by her husband, seen in refer by her primary care doctor Leighton Ruff for evaluation of severe headaches, initial evaluation was on July 05 2016.  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  She has chronic neck, shoulde pain, but denied previous history of headache, on June 23 2016, she woke up felt holo- cranial, severe headache, but there was no associated light noise sensitivity, she also feel pounding, radiating pain wrapped around her ears and jaws, she has been taking frequent Aleve, Tylenol, without helping,  She presented to the emergency room on November 3, again June 27 2016, and personally reviewed CAT scan without contrast June 27 2016, no acute abnormality, she was given Zofran, Toradol, which has helped her headache,  Over the past few weeks, she had recurrent headaches again, especially with movement, she felt 5/10 pressure headache, more to the left side frontal region, retro-orbital, she was given the prescription of tapering dose, prednisone a week course, which did help her, but after finished prednisone tapering, she had recurrent headaches again.  She denies visual loss, she does complains of aching muscles, Reviewed laboratory evaluations, cholesterol 156, LDL 104, normal CMP, with creatinine 0.81  UPDATE Jan 16th 2018: She was started on prednisone x 3 weeks, overall her headache has much improved, twice a week, She still has left side, top of her left head mild  headaches, occasionally mild blurry of left eye, she denies body achy pain,  occasioanlly leg pain, no chew difficulty  Laboratory evaluation showed normal ESR 10, C-reactive protein 0.8  REVIEW OF SYSTEMS: Full 14 system review of systems performed and notable only for headaches, numbness, eye redness, eye pain, runny nose restless leg, low back pain  ALLERGIES: No Known Allergies  HOME MEDICATIONS: Current Outpatient Prescriptions  Medication Sig Dispense Refill  . naproxen (NAPROSYN) 500 MG tablet Take 1 tablet (500 mg total) by mouth every 6 (six) hours as needed. 20 tablet 6  . propranolol (INDERAL) 20 MG tablet Take 1 tablet (20 mg total) by mouth 2 (two) times daily. 60 tablet 5  . simvastatin (ZOCOR) 40 MG tablet Take 40 mg by mouth daily.    . SUMAtriptan (IMITREX) 25 MG tablet Take 1 tablet (25 mg total) by mouth every 2 (two) hours as needed for migraine. May repeat in 2 hours if headache persists or recurs. 10 tablet 0   No current facility-administered medications for this visit.     PAST MEDICAL HISTORY: Past Medical History:  Diagnosis Date  . Headache   . Hypercholesteremia   . Knee pain     PAST SURGICAL HISTORY: Past Surgical History:  Procedure Laterality Date  . ABDOMINAL HYSTERECTOMY    . TUBAL LIGATION      FAMILY HISTORY: Family History  Problem Relation Age of Onset  . Heart disease Mother   . Hypertension Father     SOCIAL HISTORY:  Social History   Social History  . Marital status: Married    Spouse name: N/A  . Number of children:  3  . Years of education: 2 years college   Occupational History  . 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 Smoking status: Never Smoker  . Smokeless tobacco: Never Used  . Alcohol use Yes     Comment: occasional glass of wine  . Drug use: No  . Sexual activity: Not on file   Other Topics Concern  . Not on file   Social History Narrative   Lives at home with husband.    Right-handed.   3 cups caffeine per day.     PHYSICAL EXAM   Vitals:   09/07/16 0741  BP: 131/72  Pulse: 60  Weight: 177 lb (80.3 kg)  Height: 5' 3" (1.6 m)    Not recorded      Body mass index is 31.35 kg/m.  PHYSICAL EXAMNIATION:  Gen: NAD, conversant, well nourised, obese, well groomed                     Cardiovascular: Regular rate rhythm, no peripheral edema, warm, nontender. Eyes: Conjunctivae clear without exudates or hemorrhage Neck: Supple, no carotid bruits. Pulmonary: Clear to auscultation bilaterally   NEUROLOGICAL EXAM:  MENTAL STATUS: Speech:    Speech is normal; fluent and spontaneous with normal comprehension.  Cognition:     Orientation to time, place and person     Normal recent and remote memory     Normal Attention span and concentration     Normal Language, naming, repeating,spontaneous speech     Fund of knowledge   CRANIAL NERVES: CN II: Visual fields are full to confrontation. Fundoscopic exam is normal with sharp discs and no vascular changes. Pupils are round equal and briskly reactive to light. CN III, IV, VI: extraocular movement are normal. No ptosis. CN V: Facial sensation is intact to pinprick in all 3 divisions bilaterally. Corneal responses are intact.  CN VII: Face is symmetric with normal eye closure and smile. CN VIII: Hearing is normal to rubbing fingers CN IX, X: Palate elevates symmetrically. Phonation is normal. CN XI: Head turning and shoulder shrug are intact CN XII: Tongue is midline with normal movements and no atrophy.  MOTOR: There is no pronator drift of out-stretched arms. Muscle bulk and tone are normal. Muscle strength is normal.  REFLEXES: Reflexes are 2+ and symmetric at the biceps, triceps, knees, and ankles. Plantar responses are flexor.  SENSORY: Intact to light touch, pinprick, positional sensation and vibratory sensation are intact in fingers and toes.  COORDINATION: Rapid alternating movements and  fine finger movements are intact. There is no dysmetria on finger-to-nose and heel-knee-shin.    GAIT/STANCE: Posture is normal. Gait is steady with normal steps, base, arm swing, and turning. Heel and toe walking are normal. Tandem gait is normal.  Romberg is absent.   DIAGNOSTIC DATA (LABS, IMAGING, TESTING) - I reviewed patient records, labs, notes, testing and imaging myself where available.   ASSESSMENT AND PLAN  Caroline Wilson is a 71 y.o. female    New-onset headaches elderly  ESR, C-reactive protein was transiently elevated, her headache responded very well short course of steroid treatment,  Repeat ESR C-reactive protein was normal, no evidence of temporal arteritis Her headache has migraine features  Imitrex 30m prn  Continue Propanolol 20 mg twice a day as preventive medications  YMarcial Pacas M.D. Ph.D.  [facility] [address] SHartwellGLoma [place] [address]Ph: [phone]: [phone] CJJ:[id number] Dallas MD

## 2016-09-08 LAB — SEDIMENTATION RATE: Sed Rate: 10 mm/hr (ref 0–40)

## 2016-09-08 LAB — C-REACTIVE PROTEIN: CRP: 0.8 mg/L (ref 0.0–4.9)

## 2016-10-01 DIAGNOSIS — W19XXXA Unspecified fall, initial encounter: Secondary | ICD-10-CM | POA: Diagnosis not present

## 2016-10-01 DIAGNOSIS — S76311A Strain of muscle, fascia and tendon of the posterior muscle group at thigh level, right thigh, initial encounter: Secondary | ICD-10-CM | POA: Diagnosis not present

## 2017-01-04 DIAGNOSIS — D72819 Decreased white blood cell count, unspecified: Secondary | ICD-10-CM | POA: Diagnosis not present

## 2017-01-04 DIAGNOSIS — R0789 Other chest pain: Secondary | ICD-10-CM | POA: Diagnosis not present

## 2017-01-04 DIAGNOSIS — Z8249 Family history of ischemic heart disease and other diseases of the circulatory system: Secondary | ICD-10-CM | POA: Diagnosis not present

## 2017-01-04 DIAGNOSIS — R635 Abnormal weight gain: Secondary | ICD-10-CM | POA: Diagnosis not present

## 2017-01-04 DIAGNOSIS — E7800 Pure hypercholesterolemia, unspecified: Secondary | ICD-10-CM | POA: Diagnosis not present

## 2017-01-05 ENCOUNTER — Telehealth: Payer: Self-pay

## 2017-01-05 NOTE — Telephone Encounter (Signed)
SENT NOTES TO SCHELDING 

## 2017-01-31 ENCOUNTER — Ambulatory Visit (INDEPENDENT_AMBULATORY_CARE_PROVIDER_SITE_OTHER): Payer: Medicare HMO | Admitting: Internal Medicine

## 2017-01-31 ENCOUNTER — Encounter: Payer: Self-pay | Admitting: Internal Medicine

## 2017-01-31 VITALS — BP 132/84 | HR 63 | Ht 63.0 in | Wt 181.0 lb

## 2017-01-31 DIAGNOSIS — E785 Hyperlipidemia, unspecified: Secondary | ICD-10-CM | POA: Diagnosis not present

## 2017-01-31 DIAGNOSIS — M79604 Pain in right leg: Secondary | ICD-10-CM

## 2017-01-31 DIAGNOSIS — R0789 Other chest pain: Secondary | ICD-10-CM

## 2017-01-31 DIAGNOSIS — M79605 Pain in left leg: Secondary | ICD-10-CM

## 2017-01-31 NOTE — Progress Notes (Signed)
New Outpatient Visit Date: 01/31/2017  Referring Provider: Leighton Ruff, [place], [place] [address]  Chief Complaint: Chest pain  HPI:  Ms. Kretsch is a 71 y.o. female who is being seen today for the evaluation of chest pain at the request of Dr. Drema Dallas. She has a history of hyperlipidemia, headaches, and back/knee pain. She reports that over the last 3-4 years, she has had sporadic episodes of tightness across both sides of her chest and radiating to the back with a maximal intensity assessment/10. Pain comes on randomly without exacerbating factors. It is not precipitated by exertion. She does not have associated symptoms including shortness of breath, palpitations, diaphoresis, and nausea. The discomfort usually last for about a minute before resolving on its own. She has never undergone previous cardiovascular evaluation and denies a history of cardiac disease. At other times, she does not have any shortness of breath, palpitations, lightheadedness, orthopnea, PND, and claudication. She notes intermittent swelling in her ankles, which is dependent. She exercises regularly, walking 40 minutes at a time on her treadmill, 2-3 days a week. She does not have any exertional symptoms. She notes occasional low back pain as well as pain involving her thighs. She also notes intermittent leg pain at night, which is an achy, cramping feeling most pronounced along her shins.   --------------------------------------------------------------------------------------------------  Cardiovascular History & Procedures: Cardiovascular Problems:  Atypical chest pain  Risk Factors:  Hyperlipidemia, obesity, and age greater than 40  Cath/PCI:  None  CV Surgery:  None  EP Procedures and Devices:  None  Non-Invasive Evaluation(s):  None  Recent CV Pertinent Labs: Lab Results  Component Value Date   K 4.0 08/26/2014   BUN 19 08/26/2014   CREATININE 0.79 08/26/2014     --------------------------------------------------------------------------------------------------  Past Medical History:  Diagnosis Date  . Headache   . Hypercholesteremia   . Knee pain     Past Surgical History:  Procedure Laterality Date  . ABDOMINAL HYSTERECTOMY    . TUBAL LIGATION      Outpatient Encounter Prescriptions as of 01/31/2017  Medication Sig  . naproxen (NAPROSYN) 500 MG tablet Take 1 tablet (500 mg total) by mouth every 6 (six) hours as needed.  . propranolol (INDERAL) 20 MG tablet Take 1 tablet (20 mg total) by mouth 2 (two) times daily.  . simvastatin (ZOCOR) 40 MG tablet Take 40 mg by mouth daily.  . SUMAtriptan (IMITREX) 25 MG tablet Take 1 tablet (25 mg total) by mouth every 2 (two) hours as needed for migraine. May repeat in 2 hours if headache persists or recurs.   No facility-administered encounter medications on file as of 01/31/2017.     Allergies: Patient has no known allergies.  Social History   Social History  . Marital status: Married    Spouse name: N/A  . Number of children: 3  . Years of education: 2 years college   Occupational History  . 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 Smoking status: Never Smoker  . Smokeless tobacco: Never Used  . Alcohol use Yes     Comment: occasional glass of wine  . Drug use: No  . Sexual activity: Not on file   Other Topics Concern  . Not on file   Social History Narrative   Lives at home with husband.   Right-handed.   3 cups caffeine per day.    Family History  Problem Relation Age of Onset  . Heart  disease Mother   . Hypertension Father     Review of Systems: A 12-system review of systems was performed and was negative except as noted in the HPI.  --------------------------------------------------------------------------------------------------  Physical Exam: BP 132/84   Pulse 63   Ht 5\' 3"  (1.6 m)   Wt 181 lb (82.1 kg)   BMI 32.06 kg/m    General:  Obese woman, seated comfortably in the exam room. She is accompanied by her husband. HEENT: No conjunctival pallor or scleral icterus.  Moist mucous membranes.  OP clear. Neck: Supple without lymphadenopathy, thyromegaly, JVD, or HJR.  No carotid bruit. Lungs: Normal work of breathing.  Clear to auscultation bilaterally without wheezes or crackles. Heart: Regular rate and rhythm without murmurs, rubs, or gallops.  Non-displaced PMI. Abd: Bowel sounds present.  Soft, NT/ND without hepatosplenomegaly Ext: Trace ankle edema bilaterally.  Radial, PT, and DP pulses are 2+ bilaterally Skin: warm and dry without rash Neuro: CNIII-XII intact.  Strength and fine-touch sensation intact in upper and lower extremities bilaterally. Psych: Normal mood and affect.  EKG:  Normal sinus rhythm without abnormalities.  Lab Results  Component Value Date   WBC 3.2 (L) 08/26/2014   HGB 11.7 (L) 08/26/2014   HCT 37.0 08/26/2014   MCV 88.5 08/26/2014   PLT 228 08/26/2014    Lab Results  Component Value Date   NA 139 08/26/2014   K 4.0 08/26/2014   CL 107 08/26/2014   CO2 26 08/26/2014   BUN 19 08/26/2014   CREATININE 0.79 08/26/2014   GLUCOSE 111 (H) 08/26/2014    No results found for: CHOL, HDL, LDLCALC, LDLDIRECT, TRIG, CHOLHDL  Outside labs 01/04/17): CBC: WBC 3.9, Hgb 12.8, HCT 39.4, platelet 219  CMP: Sodium 140, potassium 4.6, chloride 106, CO2 29, BUN 13, creatinine 0.73, glucose 93, calcium 9.5, AST 36, ALT 40, alkaline phosphatase 48, total bilirubin 0.5, total protein 7.2, albumin 3.9  Lipid panel: Total cholesterol 221, triglycerides 114, HDL 50, LDL 149  --------------------------------------------------------------------------------------------------  ASSESSMENT AND PLAN: Atypical chest pain Ms. Brittle has a several year history of rare chest tightness radiating to the back that is nonexertional. Cardiac risk factors include hyperlipidemia, obesity, and age. Given that  she is otherwise active without chest pain, I have a fairly low suspicion for obstructive CAD. However, given her risk factors we have agreed to obtain an exercise stress echocardiogram for further assessment. Pending ischemia evaluation, we will start aspirin 81 mg daily for primary prevention.  Leg pain Pain is not consistent with claudication. She has 2+ pedal pulses in both feet. We have agreed to a statin holiday, in case some of her discomfort is related to simvastatin. If she notices improvement in her leg pain, consider switching to an alternative agent such as rosuvastatin.  Hyperlipidemia Recent lipid panel is notable for an LDL of 149 on simvastatin. We will readdress this after her aforementioned statin holiday.  Follow-up: Return to clinic in about 1 month.  Nelva Bush, MD 01/31/2017 1:31 PM

## 2017-01-31 NOTE — Patient Instructions (Signed)
Medication Instructions:  Start aspirin 81 mg daily.  DO not take simvastatin until you see Dr End in about 1 month.  Labwork: None  Testing/Procedures: Schedule an appointment for stress echocardiogram   Follow-Up: Your physician recommends that you schedule a follow-up appointment in: about 1 month with Dr End.         If you need a refill on your cardiac medications before your next appointment, please call your pharmacy.

## 2017-02-10 ENCOUNTER — Telehealth (HOSPITAL_COMMUNITY): Payer: Self-pay

## 2017-02-10 NOTE — Telephone Encounter (Signed)
LAM for Caroline Wilson. Instructions given with phone nuber left in case of any questions. S.Saladin Petrelli EMTP

## 2017-02-17 ENCOUNTER — Ambulatory Visit (HOSPITAL_COMMUNITY): Payer: Medicare HMO | Attending: Internal Medicine

## 2017-02-17 ENCOUNTER — Ambulatory Visit (HOSPITAL_COMMUNITY): Payer: Medicare HMO

## 2017-02-17 DIAGNOSIS — R0789 Other chest pain: Secondary | ICD-10-CM | POA: Insufficient documentation

## 2017-03-11 ENCOUNTER — Encounter: Payer: Self-pay | Admitting: Internal Medicine

## 2017-03-11 ENCOUNTER — Ambulatory Visit (INDEPENDENT_AMBULATORY_CARE_PROVIDER_SITE_OTHER): Payer: Medicare HMO | Admitting: Internal Medicine

## 2017-03-11 VITALS — BP 104/64 | HR 62 | Ht 63.0 in | Wt 177.0 lb

## 2017-03-11 DIAGNOSIS — E785 Hyperlipidemia, unspecified: Secondary | ICD-10-CM

## 2017-03-11 DIAGNOSIS — R0789 Other chest pain: Secondary | ICD-10-CM

## 2017-03-11 NOTE — Progress Notes (Signed)
Follow-up Outpatient Visit Date: 03/11/2017  Primary Care Provider: Leighton Ruff, [place] [address]  Chief Complaint: Follow-up chest pain  HPI:  Caroline Wilson is a 71 y.o. year-old female with history of hyperlipidemia, headaches, and\knee pain, who presents for follow-up of atypical chest pain. I last saw Caroline Wilson on 01/31/17, at which time Caroline Wilson described sporadic episodes of tightness across both sides of Caroline Wilson chest rating to the back that would come randomly without exacerbating factors. The pain had occurred off and on for more than a year. We agreed to perform an exercise stress echocardiogram, which was normal. Since Caroline Wilson last visit, Caroline Wilson has had only a single episode of chest pain that resolved spontaneously within a minute. Caroline Wilson has not had any other symptoms, including shortness of breath, palpitations, lightheadedness, and edema. Caroline Wilson does not exercise regularly but is able to do housework without any difficulties. Caroline Wilson last visit, we also discontinued simvastatin due to myalgias. Caroline Wilson notes that these have since resolved.Marland Kitchen  --------------------------------------------------------------------------------------------------  Cardiovascular History & Procedures: Cardiovascular Problems:  Atypical chest pain  Risk Factors:  Hyperlipidemia, obesity, and age greater than 85  Cath/PCI:  None  CV Surgery:  None  EP Procedures and Devices:  None  Non-Invasive Evaluation(s):  Exercise stress echocardiogram (02/17/17): Normal study without evidence of ischemia. Normal baseline LV function. Patient exercised 9 minutes achieving 10.1 metastases at peak heart rate of 164 bpm (109% MPHR).  Recent CV Pertinent Labs: Lab Results  Component Value Date   K 4.0 08/26/2014   BUN 19 08/26/2014   CREATININE 0.79 08/26/2014    Past medical and surgical history were reviewed and updated in EPIC.  Current Meds  Medication Sig  . aspirin EC 81 MG tablet  Take 1 tablet (81 mg total) by mouth daily.    Allergies: Patient has no known allergies.  Social History   Social History  . Marital status: Married    Spouse name: N/A  . Number of children: 3  . Years of education: 2 years college   Occupational History  . 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 Smoking status: Never Smoker  . Smokeless tobacco: Never Used  . Alcohol use 0.6 oz/week    1 Glasses of wine per week  . Drug use: No  . Sexual activity: Not on file   Other Topics Concern  . Not on file   Social History Narrative   Lives at home with husband.   Right-handed.   3 cups caffeine per day.    Family History  Problem Relation Age of Onset  . Heart disease Mother   . Hypertension Father     Review of Systems: A 12-system review of systems was performed and was negative except as noted in the HPI.  --------------------------------------------------------------------------------------------------  Physical Exam: BP 104/64   Pulse 62   Ht 5\' 3"  (1.6 m)   Wt 177 lb (80.3 kg)   SpO2 98%   BMI 31.35 kg/m   General:  Overweight woman, seated comfortably in the exam room. Caroline Wilson is accompanied by Caroline Wilson husband. HEENT: No conjunctival pallor or scleral icterus. Moist mucous membranes.  OP clear. Neck: Supple without lymphadenopathy, thyromegaly, JVD, or HJR.  Lungs: Normal work of breathing. Clear to auscultation bilaterally without wheezes or crackles. Heart: Regular rate and rhythm without murmurs, rubs, or gallops. Non-displaced PMI. Abd: Bowel sounds present. Soft, NT/ND without hepatosplenomegaly Ext: No lower extremity edema. Radial, PT, and  DP pulses are 2+ bilaterally. Skin: Warm and dry without rash.  Lab Results  Component Value Date   WBC 3.2 (L) 08/26/2014   HGB 11.7 (L) 08/26/2014   HCT 37.0 08/26/2014   MCV 88.5 08/26/2014   PLT 228 08/26/2014    Lab Results  Component Value Date   NA 139 08/26/2014   K 4.0  08/26/2014   CL 107 08/26/2014   CO2 26 08/26/2014   BUN 19 08/26/2014   CREATININE 0.79 08/26/2014   GLUCOSE 111 (H) 08/26/2014    No results found for: CHOL, HDL, LDLCALC, LDLDIRECT, TRIG, CHOLHDL  --------------------------------------------------------------------------------------------------  ASSESSMENT AND PLAN: Atypical chest pain Only one brief recurrence of chest pain was noted by Caroline Wilson since our last visit. Given the atypical nature of Caroline Wilson symptoms and normal exercise stress echocardiogram, I have a low suspicion for obstructive coronary artery disease. I have recommended that Caroline Wilson continue with primary prevention, including weight loss and exercise. No further cardiac testing is recommended at this time.  Hyperlipidemia Myalgias have resolved with discontinuation of simvastatin. LDL in May was mildly elevated at 149. Given lack of atherosclerotic cardiovascular disease, it is reasonable to attempt lifestyle modifications to improve Caroline Wilson LDL. I have provided Caroline Wilson with information about the Mediterranean diet. If this is unsuccessful, an alternative statin could be tried in the future. I will defer this to Dr. Drema Dallas.  Follow-up: Return to clinic as needed.  Nelva Bush, MD 03/11/2017 12:13 PM

## 2017-03-11 NOTE — Patient Instructions (Signed)
Mediterranean Diet A Mediterranean diet refers to food and lifestyle choices that are based on the traditions of countries located on the [place]. This way of eating has been shown to help prevent certain conditions and improve outcomes for people who have chronic diseases, like kidney disease and heart disease. What are tips for following this plan? Lifestyle  Cook and eat meals together with your family, when possible.  Drink enough fluid to keep your urine clear or pale yellow.  Be physically active every day. This includes: ? Aerobic exercise like running or swimming. ? Leisure activities like gardening, walking, or housework.  Get 7-8 hours of sleep each night.  If recommended by your health care provider, drink red wine in moderation. This means 1 glass a day for nonpregnant women and 2 glasses a day for men. A glass of wine equals 5 oz (150 mL). Reading food labels  Check the serving size of packaged foods. For foods such as rice and pasta, the serving size refers to the amount of cooked product, not dry.  Check the total fat in packaged foods. Avoid foods that have saturated fat or trans fats.  Check the ingredients list for added sugars, such as corn syrup. Shopping  At the grocery store, buy most of your food from the areas near the walls of the store. This includes: ? Fresh fruits and vegetables (produce). ? Grains, beans, nuts, and seeds. Some of these may be available in unpackaged forms or large amounts (in bulk). ? Fresh seafood. ? Poultry and eggs. ? Low-fat dairy products.  Buy whole ingredients instead of prepackaged foods.  Buy fresh fruits and vegetables in-season from local farmers markets.  Buy frozen fruits and vegetables in resealable bags.  If you do not have access to quality fresh seafood, buy precooked frozen shrimp or canned fish, such as tuna, salmon, or sardines.  Buy small amounts of raw or cooked vegetables, salads, or olives from the  deli or salad bar at your store.  Stock your pantry so you always have certain foods on hand, such as olive oil, canned tuna, canned tomatoes, rice, pasta, and beans. Cooking  Cook foods with extra-virgin olive oil instead of using butter or other vegetable oils.  Have meat as a side dish, and have vegetables or grains as your main dish. This means having meat in small portions or adding small amounts of meat to foods like pasta or stew.  Use beans or vegetables instead of meat in common dishes like chili or lasagna.  Experiment with different cooking methods. Try roasting or broiling vegetables instead of steaming or sauteing them.  Add frozen vegetables to soups, stews, pasta, or rice.  Add nuts or seeds for added healthy fat at each meal. You can add these to yogurt, salads, or vegetable dishes.  Marinate fish or vegetables using olive oil, lemon juice, garlic, and fresh herbs. Meal planning  Plan to eat 1 vegetarian meal one day each week. Try to work up to 2 vegetarian meals, if possible.  Eat seafood 2 or more times a week.  Have healthy snacks readily available, such as: ? Vegetable sticks with hummus. ? Greek yogurt. ? Fruit and nut trail mix.  Eat balanced meals throughout the week. This includes: ? Fruit: 2-3 servings a day ? Vegetables: 4-5 servings a day ? Low-fat dairy: 2 servings a day ? Fish, poultry, or lean meat: 1 serving a day ? Beans and legumes: 2 or more servings a week ? Nuts   and seeds: 1-2 servings a day ? Whole grains: 6-8 servings a day ? Extra-virgin olive oil: 3-4 servings a day  Limit red meat and sweets to only a few servings a month What are my food choices?  Mediterranean diet ? Recommended ? Grains: Whole-grain pasta. Brown rice. Bulgar wheat. Polenta. Couscous. Whole-wheat bread. [place]. ? Vegetables: Artichokes. Beets. Broccoli. Cabbage. Carrots. Eggplant. Green beans. Chard. Kale. Spinach. Onions. Leeks. Peas. Squash.  Tomatoes. Peppers. Radishes. ? Fruits: Apples. Apricots. Avocado. Berries. Bananas. Cherries. Dates. Figs. Grapes. Lemons. Melon. Oranges. Peaches. Plums. Pomegranate. ? Meats and other protein foods: Beans. Almonds. Sunflower seeds. Pine nuts. Peanuts. [place]. Salmon. Scallops. Shrimp. [place]. Tilapia. Clams. Oysters. Eggs. ? Dairy: Low-fat milk. Cheese. Greek yogurt. ? Beverages: Water. Red wine. Herbal tea. ? Fats and oils: Extra virgin olive oil. Avocado oil. Grape seed oil. ? Sweets and desserts: [place] yogurt with honey. Baked apples. Poached pears. Trail mix. ? Seasoning and other foods: Basil. Cilantro. Coriander. Cumin. Mint. Parsley. Sage. Rosemary. Tarragon. Garlic. Oregano. Thyme. Pepper. Balsalmic vinegar. Tahini. Hummus. Tomato sauce. Olives. Mushrooms. ? Limit these ? Grains: Prepackaged pasta or rice dishes. Prepackaged cereal with added sugar. ? Vegetables: Deep fried potatoes (french fries). ? Fruits: Fruit canned in syrup. ? Meats and other protein foods: Beef. Pork. Lamb. Poultry with skin. Hot dogs. [place] Salines. ? Dairy: Ice cream. Sour cream. Whole milk. ? Beverages: Juice. Sugar-sweetened soft drinks. Beer. Liquor and spirits. ? Fats and oils: Butter. Canola oil. Vegetable oil. Beef fat (tallow). Lard. ? Sweets and desserts: Cookies. Cakes. Pies. Candy. ? Seasoning and other foods: Mayonnaise. Premade sauces and marinades. ? The items listed may not be a complete list. Talk with your dietitian about what dietary choices are right for you. Summary  The Mediterranean diet includes both food and lifestyle choices.  Eat a variety of fresh fruits and vegetables, beans, nuts, seeds, and whole grains.  Limit the amount of red meat and sweets that you eat.  Talk with your health care provider about whether it is safe for you to drink red wine in moderation. This means 1 glass a day for nonpregnant women and 2 glasses a day for men. A glass of wine equals 5 oz (150 mL). This information  is not intended to replace advice given to you by your health care provider. Make sure you discuss any questions you have with your health care provider. Document Released: 04/01/2016 Document Revised: 05/04/2016 Document Reviewed: 04/01/2016 Elsevier Interactive Patient Education  2018 [employer].   Medication Instructions:  Your physician recommends that you continue on your current medications as directed. Please refer to the Current Medication list given to you today.   Labwork: None   Testing/Procedures: None   Follow-Up: As needed with Dr End.       If you need a refill on your cardiac medications before your next appointment, please call your pharmacy.

## 2017-03-12 ENCOUNTER — Encounter: Payer: Self-pay | Admitting: Internal Medicine

## 2017-03-25 DIAGNOSIS — Z0100 Encounter for examination of eyes and vision without abnormal findings: Secondary | ICD-10-CM | POA: Diagnosis not present

## 2017-04-18 ENCOUNTER — Other Ambulatory Visit: Payer: Self-pay | Admitting: Family Medicine

## 2017-04-18 DIAGNOSIS — Z1231 Encounter for screening mammogram for malignant neoplasm of breast: Secondary | ICD-10-CM

## 2017-05-12 DIAGNOSIS — Z124 Encounter for screening for malignant neoplasm of cervix: Secondary | ICD-10-CM | POA: Diagnosis not present

## 2017-05-12 DIAGNOSIS — Z79890 Hormone replacement therapy (postmenopausal): Secondary | ICD-10-CM | POA: Diagnosis not present

## 2017-05-20 ENCOUNTER — Ambulatory Visit
Admission: RE | Admit: 2017-05-20 | Discharge: 2017-05-20 | Disposition: A | Discharge status: Home / Self Care | Payer: Medicare HMO | Source: Ambulatory Visit | Attending: Family Medicine | Admitting: Family Medicine

## 2017-05-20 DIAGNOSIS — Z1231 Encounter for screening mammogram for malignant neoplasm of breast: Secondary | ICD-10-CM | POA: Diagnosis not present

## 2017-05-23 ENCOUNTER — Other Ambulatory Visit: Payer: Self-pay | Admitting: Family Medicine

## 2017-05-23 DIAGNOSIS — R928 Other abnormal and inconclusive findings on diagnostic imaging of breast: Secondary | ICD-10-CM

## 2017-05-30 ENCOUNTER — Other Ambulatory Visit: Payer: Self-pay | Admitting: Family Medicine

## 2017-05-30 ENCOUNTER — Ambulatory Visit
Admission: RE | Admit: 2017-05-30 | Discharge: 2017-05-30 | Disposition: A | Discharge status: Home / Self Care | Payer: Medicare HMO | Source: Ambulatory Visit | Attending: Family Medicine | Admitting: Family Medicine

## 2017-05-30 DIAGNOSIS — R922 Inconclusive mammogram: Secondary | ICD-10-CM | POA: Diagnosis not present

## 2017-05-30 DIAGNOSIS — R921 Mammographic calcification found on diagnostic imaging of breast: Secondary | ICD-10-CM

## 2017-05-30 DIAGNOSIS — R928 Other abnormal and inconclusive findings on diagnostic imaging of breast: Secondary | ICD-10-CM

## 2017-06-06 ENCOUNTER — Emergency Department (HOSPITAL_BASED_OUTPATIENT_CLINIC_OR_DEPARTMENT_OTHER)
Admission: EM | Admit: 2017-06-06 | Discharge: 2017-06-06 | Disposition: A | Discharge status: Home / Self Care | Payer: Medicare HMO | Attending: Emergency Medicine | Admitting: Emergency Medicine

## 2017-06-06 ENCOUNTER — Ambulatory Visit
Admission: RE | Admit: 2017-06-06 | Discharge: 2017-06-06 | Disposition: A | Discharge status: Home / Self Care | Payer: Medicare HMO | Source: Ambulatory Visit | Attending: Family Medicine | Admitting: Family Medicine

## 2017-06-06 ENCOUNTER — Encounter (HOSPITAL_BASED_OUTPATIENT_CLINIC_OR_DEPARTMENT_OTHER): Payer: Self-pay | Admitting: Emergency Medicine

## 2017-06-06 DIAGNOSIS — Y658 Other specified misadventures during surgical and medical care: Secondary | ICD-10-CM | POA: Diagnosis not present

## 2017-06-06 DIAGNOSIS — T888XXA Other specified complications of surgical and medical care, not elsewhere classified, initial encounter: Secondary | ICD-10-CM | POA: Insufficient documentation

## 2017-06-06 DIAGNOSIS — R921 Mammographic calcification found on diagnostic imaging of breast: Secondary | ICD-10-CM | POA: Diagnosis not present

## 2017-06-06 DIAGNOSIS — N6489 Other specified disorders of breast: Secondary | ICD-10-CM | POA: Insufficient documentation

## 2017-06-06 DIAGNOSIS — Z7982 Long term (current) use of aspirin: Secondary | ICD-10-CM | POA: Insufficient documentation

## 2017-06-06 DIAGNOSIS — N6459 Other signs and symptoms in breast: Secondary | ICD-10-CM | POA: Diagnosis present

## 2017-06-06 DIAGNOSIS — C50919 Malignant neoplasm of unspecified site of unspecified female breast: Secondary | ICD-10-CM

## 2017-06-06 DIAGNOSIS — T8131XA Disruption of external operation (surgical) wound, not elsewhere classified, initial encounter: Secondary | ICD-10-CM | POA: Diagnosis not present

## 2017-06-06 DIAGNOSIS — D0511 Intraductal carcinoma in situ of right breast: Secondary | ICD-10-CM | POA: Diagnosis not present

## 2017-06-06 DIAGNOSIS — N6452 Nipple discharge: Secondary | ICD-10-CM | POA: Diagnosis not present

## 2017-06-06 DIAGNOSIS — T8189XA Other complications of procedures, not elsewhere classified, initial encounter: Secondary | ICD-10-CM | POA: Diagnosis not present

## 2017-06-06 HISTORY — DX: Malignant neoplasm of unspecified site of unspecified female breast: C50.919

## 2017-06-06 NOTE — ED Notes (Signed)
Pt arrived via EMS, Intake questions asked.  Husband upset that this nurse asked if the patient called the doctor who did the procedure.  When I answered that it was because they did the procedure, husband stated angrily, "well you have a great attitude."  Husband answering questions and not allowing patient to answer.

## 2017-06-06 NOTE — ED Triage Notes (Signed)
Pt reports bleeding from RT breast bx site since approx 1400 today; bx was performed at 0830.

## 2017-06-06 NOTE — Discharge Instructions (Signed)
Do not remove the compression tape till tomorrow morning.  Take tylenol for pain tonight and apply ice

## 2017-06-06 NOTE — ED Provider Notes (Signed)
[facility] EMERGENCY DEPARTMENT Provider Note   CSN: [id number] Arrival date & time: 06/06/17  1439     History   Chief Complaint Chief Complaint  Patient presents with  . Post-op Problem    HPI Caroline Wilson is a 70 y.o. female.  Patient is a 71 year old well-appearing female with no significant medical problems presenting today with bleeding from a breast biopsy site. Patient had a breast biopsy done around 8:30 this morning. She was then at work pushing a cart to [employer] when it suddenly began pouring blood. Currently the bleeding has stopped but she has never had issues like this before. She does not take any anticoagulation. She denies any trauma to the area. She is having some mild tenderness to the area but otherwise no complaints.   The history is provided by the patient.    Past Medical History:  Diagnosis Date  . Headache   . Hypercholesteremia   . Knee pain     Patient Active Problem List   Diagnosis Date Noted  . Atypical chest pain 01/31/2017  . Pain in both lower extremities 01/31/2017  . Hyperlipidemia 01/31/2017  . Abnormal laboratory test 07/07/2016  . New onset headache 07/05/2016    Past Surgical History:  Procedure Laterality Date  . ABDOMINAL HYSTERECTOMY    . BREAST BIOPSY    . TUBAL LIGATION      OB History    Gravida Para Term Preterm AB Living   3 3     0 3   SAB TAB Ectopic Multiple Live Births   0 0 0 0 3       Home Medications    Prior to Admission medications   Medication Sig Start Date End Date Taking? Authorizing Provider  aspirin EC 81 MG tablet Take 1 tablet (81 mg total) by mouth daily. 01/31/17   End, Harrell Gave, MD  naproxen (NAPROSYN) 500 MG tablet Take 1 tablet (500 mg total) by mouth every 6 (six) hours as needed. Patient not taking: Reported on 03/11/2017 07/06/16   Marcial Pacas, MD  simvastatin (ZOCOR) 40 MG tablet Take 1 tablet (40 mg total) by mouth daily. ON HOLD 01/31/17 01/31/17   End,  Harrell Gave, MD  SUMAtriptan (IMITREX) 25 MG tablet Take 1 tablet (25 mg total) by mouth every 2 (two) hours as needed for migraine. May repeat in 2 hours if headache persists or recurs. Patient not taking: Reported on 03/11/2017 07/05/16   Marcial Pacas, MD    Family History Family History  Problem Relation Age of Onset  . Heart disease Mother   . Hypertension Father     Social History Social History  Substance Use Topics  . Smoking status: Never Smoker  . Smokeless tobacco: Never Used  . Alcohol use 0.6 oz/week    1 Glasses of wine per week     Allergies   Patient has no known allergies.   Review of Systems Review of Systems  All other systems reviewed and are negative.    Physical Exam Updated Vital Signs BP (!) 156/85   Pulse 69   Temp 98.2 F (36.8 C)   Resp 16   Ht 5\' 3"  (1.6 m)   Wt 80.7 kg (178 lb)   SpO2 98%   BMI 31.53 kg/m   Physical Exam  Constitutional: She appears well-developed and well-nourished. No distress.  HENT:  Head: Normocephalic and atraumatic.  Cardiovascular: Normal rate.   Pulmonary/Chest: Effort normal. Right breast exhibits tenderness. Right breast exhibits no  inverted nipple and no nipple discharge. There is breast swelling.    Genitourinary: No breast bleeding.  Nursing note and vitals reviewed.    ED Treatments / Results  Labs (all labs ordered are listed, but only abnormal results are displayed) Labs Reviewed - No data to display  EKG  EKG Interpretation None       Radiology Mm Clip Placement Right  Result Date: 06/06/2017 CLINICAL DATA:  Evaluate clip placement following stereotactic guided right breast biopsy. EXAM: DIAGNOSTIC RIGHT MAMMOGRAM POST STEREOTACTIC BIOPSY COMPARISON:  Previous exam(s). FINDINGS: Mammographic images were obtained following stereotactic guided biopsy of calcifications within the upper inner right breast. The coil shaped clip is in satisfactory position. IMPRESSION: Satisfactory clip  placement following stereotactic guided right breast biopsy. Please note residual calcifications extend up to 1.5 cm posterior/ medial/ superior to the biopsy clip. Final Assessment: Post Procedure Mammograms for Marker Placement Electronically Signed   By: Margarette Canada M.D.   On: 06/06/2017 09:35   Mm Rt Breast Bx W Loc Dev 1st Lesion Image Bx Spec Stereo Guide  Result Date: 06/06/2017 CLINICAL DATA:  71 year old female for tissue sampling of indeterminate calcifications within the upper inner right breast. EXAM: RIGHT BREAST STEREOTACTIC CORE NEEDLE BIOPSY COMPARISON:  Previous exams. FINDINGS: The patient and I discussed the procedure of stereotactic-guided biopsy including benefits and alternatives. We discussed the high likelihood of a successful procedure. We discussed the risks of the procedure including infection, bleeding, tissue injury, clip migration, and inadequate sampling. Informed written consent was given. The usual time out protocol was performed immediately prior to the procedure. Using sterile technique and 1% Lidocaine as local anesthetic, under stereotactic guidance, a 9 gauge vacuum assisted device was used to perform core needle biopsy of calcifications in the upper inner quadrant of the right breast using a superior approach. Specimen radiograph was performed showing calcifications. Specimens with calcifications are identified for pathology. Lesion quadrant: Upper inner right breast At the conclusion of the procedure, a coil shaped tissue marker clip was deployed into the biopsy cavity. Follow-up 2-view mammogram was performed and dictated separately. IMPRESSION: Stereotactic-guided biopsy of upper inner right breast calcifications. No apparent complications. Electronically Signed   By: Margarette Canada M.D.   On: 06/06/2017 09:26    Procedures Procedures (including critical care time)  Medications Ordered in ED Medications - No data to display   Initial Impression / Assessment and  Plan / ED Course  I have reviewed the triage vital signs and the nursing notes.  Pertinent labs & imaging results that were available during my care of the patient were reviewed by me and considered in my medical decision making (see chart for details).     Patient presenting due to excessive bleeding from her breast biopsy site that occurred at 2 PM this afternoon. Upon arrival here patient is currently hemostatic with no further bleeding. Does appear to have a hematoma. She is not on anticoagulation. Compression tape applied to the area with a thrombin pad to prevent any further bleeding. Recommended not using her right arm for any pushing, lifting or pulling. Patient can remove the bandage tomorrow morning and will contact the breast Center.  Final Clinical Impressions(s) / ED Diagnoses   Final diagnoses:  Bleeding from breast    New Prescriptions New Prescriptions   No medications on file     Blanchie Dessert, MD 06/06/17 1553

## 2017-06-10 ENCOUNTER — Ambulatory Visit: Payer: Self-pay | Admitting: General Surgery

## 2017-06-10 DIAGNOSIS — D0511 Intraductal carcinoma in situ of right breast: Secondary | ICD-10-CM | POA: Diagnosis not present

## 2017-06-15 ENCOUNTER — Encounter: Payer: Self-pay | Admitting: Radiation Oncology

## 2017-06-17 ENCOUNTER — Other Ambulatory Visit: Payer: Self-pay | Admitting: General Surgery

## 2017-06-17 DIAGNOSIS — D0511 Intraductal carcinoma in situ of right breast: Secondary | ICD-10-CM

## 2017-06-20 ENCOUNTER — Encounter: Payer: Self-pay | Admitting: Radiation Oncology

## 2017-06-20 NOTE — Progress Notes (Signed)
Location of Breast Cancer: Right upper Inner Breast (2.8cm)   Histology per Pathology Report: Diagnosis 06/06/17: Breast, right, needle core biopsy, upper inner right breast - DUCTAL CARCINOMA IN SITU WITH NECROSIS AND CALCIFICATIONS  Receptor Status: ER(0%neg), PR (0%neg), Her2-neu (), Ki-()  Did patient present with symptoms (if so, please note symptoms) or was this found on screening mammography?: Routine mammogram Past/Anticipated interventions by surgeon, if any: Dr. Autumn Messing, MD, dvelopded hematoma after bx, ,   07/04/17  Lumpectomy  Scheduled   Past/Anticipated interventions by medical oncology, if any: Chemotherapy :Dr. Burr Medico, MD 06/22/17 at 11:00am  Lymphedema issues, if any:   NO  Pain issues, if any: HX  back, chest pain,migraines, pain in right breast from bx, takes tylenol e.s. prn  SAFETY ISSUES:  Prior radiation? NO  Pacemaker/ICD?  NO  Possible current pregnancy? NO  Is the patient on methotrexate? NO  Current Complaints / other details:  Menarche age 32, G35P3   Hx breast cancer in sister and  2 aunts, 1 paternal aunt, the other maternal aunt,  Mother heart disease,    Allergies:NKA BP 140/64   Pulse 62   Temp 98.6 F (37 C) (Oral)   Resp 20   Ht '5\' 3"'  (1.6 m)   Wt 180 lb 12.8 oz (82 kg)   BMI 32.03 kg/m   Wt Readings from Last 3 Encounters:  06/22/17 180 lb 12.8 oz (82 kg)  06/06/17 178 lb (80.7 kg)  03/11/17 177 lb (80.3 kg)    Rebecca Eaton, RN 06/20/2017,3:08 PM

## 2017-06-21 DIAGNOSIS — D0511 Intraductal carcinoma in situ of right breast: Secondary | ICD-10-CM | POA: Insufficient documentation

## 2017-06-22 ENCOUNTER — Encounter: Payer: Medicare HMO | Admitting: Hematology

## 2017-06-22 ENCOUNTER — Ambulatory Visit
Admission: RE | Admit: 2017-06-22 | Discharge: 2017-06-22 | Disposition: A | Discharge status: Home / Self Care | Payer: Medicare HMO | Source: Ambulatory Visit | Attending: Radiation Oncology | Admitting: Radiation Oncology

## 2017-06-22 ENCOUNTER — Encounter: Payer: Self-pay | Admitting: Radiation Oncology

## 2017-06-22 VITALS — BP 140/64 | HR 62 | Temp 98.6°F | Resp 20 | Ht 63.0 in | Wt 180.8 lb

## 2017-06-22 DIAGNOSIS — Z803 Family history of malignant neoplasm of breast: Secondary | ICD-10-CM | POA: Insufficient documentation

## 2017-06-22 DIAGNOSIS — Z79899 Other long term (current) drug therapy: Secondary | ICD-10-CM | POA: Diagnosis not present

## 2017-06-22 DIAGNOSIS — D0511 Intraductal carcinoma in situ of right breast: Secondary | ICD-10-CM

## 2017-06-22 DIAGNOSIS — Z171 Estrogen receptor negative status [ER-]: Principal | ICD-10-CM

## 2017-06-22 DIAGNOSIS — E7800 Pure hypercholesterolemia, unspecified: Secondary | ICD-10-CM | POA: Diagnosis not present

## 2017-06-22 DIAGNOSIS — Z8249 Family history of ischemic heart disease and other diseases of the circulatory system: Secondary | ICD-10-CM | POA: Insufficient documentation

## 2017-06-22 DIAGNOSIS — Z510 Encounter for antineoplastic radiation therapy: Secondary | ICD-10-CM | POA: Insufficient documentation

## 2017-06-22 DIAGNOSIS — Z90710 Acquired absence of both cervix and uterus: Secondary | ICD-10-CM | POA: Insufficient documentation

## 2017-06-22 DIAGNOSIS — Z791 Long term (current) use of non-steroidal anti-inflammatories (NSAID): Secondary | ICD-10-CM | POA: Insufficient documentation

## 2017-06-22 DIAGNOSIS — C50211 Malignant neoplasm of upper-inner quadrant of right female breast: Secondary | ICD-10-CM

## 2017-06-22 DIAGNOSIS — Z9889 Other specified postprocedural states: Secondary | ICD-10-CM | POA: Insufficient documentation

## 2017-06-22 DIAGNOSIS — Z7982 Long term (current) use of aspirin: Secondary | ICD-10-CM | POA: Insufficient documentation

## 2017-06-22 HISTORY — DX: Malignant neoplasm of unspecified site of unspecified female breast: C50.919

## 2017-06-22 NOTE — Progress Notes (Signed)
Please see the Nurse Progress Note in the MD Initial Consult Encounter for this patient. 

## 2017-06-22 NOTE — Progress Notes (Signed)
Radiation Oncology         [phone]) [phone] ________________________________  Name: Caroline Wilson        MRN: [id number]  Date of Service: 06/22/2017 DOB: 11/13/1945  CC:Leighton Ruff, MD  Jovita Kussmaul, MD     REFERRING PHYSICIAN: Autumn Messing III, MD   DIAGNOSIS: The encounter diagnosis was Malignant neoplasm of upper-inner quadrant of right breast in female, estrogen receptor negative ([place]).   HISTORY OF PRESENT ILLNESS: Caroline Wilson is a 71 y.o. female seen at the request of Dr. Marlou Starks for a newly diagnosed right breast cancer. The patient was seen for screening mammogram which detected calcifications that measured 1.6 x 2.8 x 1.2 cm. 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 She is planning on right breast lumpectomy on 07/04/17, and comes today to discuss options of adjuvant therapy.   PREVIOUS RADIATION THERAPY: No   PAST MEDICAL HISTORY:  Past Medical History:  Diagnosis Date  . Breast cancer ([place]) 06/06/2017   right breast  . Headache   . Hypercholesteremia   . Knee pain        PAST SURGICAL HISTORY: Past Surgical History:  Procedure Laterality Date  . ABDOMINAL HYSTERECTOMY    . BREAST BIOPSY    . TUBAL LIGATION       FAMILY HISTORY:  Family History  Problem Relation Age of Onset  . Heart disease Mother   . Hypertension Father   . Cancer Sister        breast     SOCIAL HISTORY:  reports that she has never smoked. She has never used smokeless tobacco. She reports that she drinks about 0.6 oz of alcohol per week . She reports that she does not use drugs. The patient is married. 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 She and her husband relocated from [place] after retirement.   ALLERGIES: Patient has no known allergies.   MEDICATIONS:  Current Outpatient Prescriptions  Medication Sig Dispense Refill  . acetaminophen (TYLENOL) 500 MG tablet Take 500 mg by mouth every 6 (six)  hours as needed.    . cholecalciferol (VITAMIN D) 1000 units tablet Take 1,000 Units by mouth daily.    . Multiple Vitamin (MULTIVITAMIN) tablet Take 1 tablet by mouth daily.    . naproxen (NAPROSYN) 500 MG tablet Take 1 tablet (500 mg total) by mouth every 6 (six) hours as needed. 20 tablet 6  . aspirin EC 81 MG tablet Take 1 tablet (81 mg total) by mouth daily.    . simvastatin (ZOCOR) 40 MG tablet Take 1 tablet (40 mg total) by mouth daily. ON HOLD 01/31/17    . SUMAtriptan (IMITREX) 25 MG tablet Take 1 tablet (25 mg total) by mouth every 2 (two) hours as needed for migraine. May repeat in 2 hours if headache persists or recurs. (Patient not taking: Reported on 03/11/2017) 10 tablet 0   No current facility-administered medications for this encounter.      REVIEW OF SYSTEMS: On review of systems, the patient reports that she is doing well overall. She denies any chest pain, shortness of breath, cough, fevers, chills, night sweats, unintended weight changes. She denies any bowel or bladder disturbances, and denies abdominal pain, nausea or vomiting. She denies any new musculoskeletal or joint aches or pains. A complete review of systems is obtained and is otherwise negative.     PHYSICAL EXAM:  Wt Readings from Last 3 Encounters:  06/22/17 180 lb 12.8 oz (82 kg)  06/06/17 178 lb (80.7 kg)  03/11/17 177 lb (80.3 kg)   Temp Readings from Last 3 Encounters:  06/22/17 98.6 F (37 C) (Oral)  06/06/17 98.2 F (36.8 C)  06/27/16 98.1 F (36.7 C) (Oral)   BP Readings from Last 3 Encounters:  06/22/17 140/64  06/06/17 (!) 156/85  03/11/17 104/64   Pulse Readings from Last 3 Encounters:  06/22/17 62  06/06/17 69  03/11/17 62   Pain Assessment Pain Score: 4  Pain Loc: Breast (right sore and hard)/10  In general this is a well appearing African American female in no acute distress. She is alert and oriented x4 and appropriate throughout the examination. HEENT reveals that the patient  is normocephalic, atraumatic. EOMs are intact. PERRLA. Skin is intact without any evidence of gross lesions. Cardiovascular exam reveals a regular rate and rhythm, no clicks rubs or murmurs are auscultated. Chest is clear to auscultation bilaterally. Lymphatic assessment is performed and does not reveal any adenopathy in the cervical, supraclavicular, axillary, or inguinal chains. Bilateral breast exam reveals no palpable mass in the left breast. There is a palpable hematoma in the biopsy site that is about 4 cm. No nipple bleeding or discharge is noted of either breast.  Abdomen has active bowel sounds in all quadrants and is intact. The abdomen is soft, non tender, non distended. Lower extremities are negative for pretibial pitting edema, deep calf tenderness, cyanosis or clubbing.   ECOG = 0  0 - Asymptomatic (Fully active, able to carry on all predisease activities without restriction)  1 - Symptomatic but completely ambulatory (Restricted in physically strenuous activity but ambulatory and able to carry out work of a light or sedentary nature. For example, light housework, office work)  2 - Symptomatic, <50% in bed during the day (Ambulatory and capable of all self care but unable to carry out any work activities. Up and about more than 50% of waking hours)  3 - Symptomatic, >50% in bed, but not bedbound (Capable of only limited self-care, confined to bed or chair 50% or more of waking hours)  4 - Bedbound (Completely disabled. Cannot carry on any self-care. Totally confined to bed or chair)  5 - Death   Eustace Pen MM, Creech RH, Tormey DC, et al. [phone]). "Toxicity and response criteria of the [facility]". [place] Oncol. 5 (6): 649-55    LABORATORY DATA:  Lab Results  Component Value Date   WBC 3.2 (L) 08/26/2014   HGB 11.7 (L) 08/26/2014   HCT 37.0 08/26/2014   MCV 88.5 08/26/2014   PLT 228 08/26/2014   Lab Results  Component Value Date   NA 139 08/26/2014    K 4.0 08/26/2014   CL 107 08/26/2014   CO2 26 08/26/2014   No results found for: ALT, AST, GGT, ALKPHOS, BILITOT    RADIOGRAPHY: Mm Digital Diagnostic Unilat R  Result Date: 05/30/2017 CLINICAL DATA:  71 year old patient recalled from recent screening mammogram for evaluation of calcifications in the upper inner quadrant of the right breast. EXAM: DIGITAL DIAGNOSTIC RIGHT MAMMOGRAM COMPARISON:  May 20, 2017 and earlier priors ACR Breast Density Category c: The breast tissue is heterogeneously dense, which may obscure small masses. FINDINGS: Magnification views of the upper inner right breast, anterior to middle thirds, demonstrate a heterogeneous group of microcalcifications, some of which are linearly oriented. These calcifications span 1.6 x 2.8 x 1.2 cm. Ductal carcinoma in situ cannot be excluded. There  is no associated mass or architectural distortion. IMPRESSION: Group of calcifications in the upper inner quadrant of the right breast, for which malignancy cannot be excluded. RECOMMENDATION: Stereotactic biopsy is recommended and has been scheduled for Monday June 06, 2017. I have discussed the findings and recommendations with the patient. Results were also provided in writing at the conclusion of the visit. If applicable, a reminder letter will be sent to the patient regarding the next appointment. BI-RADS CATEGORY  4: Suspicious. Electronically Signed   By: Curlene Dolphin M.D.   On: 05/30/2017 10:09   Mm Clip Placement Right  Result Date: 06/06/2017 CLINICAL DATA:  Evaluate clip placement following stereotactic guided right breast biopsy. EXAM: DIAGNOSTIC RIGHT MAMMOGRAM POST STEREOTACTIC BIOPSY COMPARISON:  Previous exam(s). FINDINGS: Mammographic images were obtained following stereotactic guided biopsy of calcifications within the upper inner right breast. The coil shaped clip is in satisfactory position. IMPRESSION: Satisfactory clip placement following stereotactic guided right  breast biopsy. Please note residual calcifications extend up to 1.5 cm posterior/ medial/ superior to the biopsy clip. Final Assessment: Post Procedure Mammograms for Marker Placement Electronically Signed   By: Margarette Canada M.D.   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 This was found to be concordant by Dr. Hassan Rowan. Pathology results were discussed with the patient by telephone. The patient reported doing well after the biopsy with tenderness and bleeding at the site. Post biopsy instructions and care were reviewed and questions were answered. The patient was encouraged to call [facility] for any additional concerns. Surgical consultation has been arranged with Dr. Autumn Messing at [facility] on June 10, 2017. Pathology results reported by Terie Purser, RN on 06/07/2017. Electronically Signed   By: Margarette Canada M.D.   On: 06/07/2017 14:49   Result Date: 06/07/2017 CLINICAL DATA:  71 year old female for tissue sampling of indeterminate calcifications within the upper inner right breast. EXAM: RIGHT BREAST STEREOTACTIC CORE NEEDLE BIOPSY COMPARISON:  Previous exams. FINDINGS: The patient and I discussed the procedure of stereotactic-guided biopsy including benefits and alternatives. We discussed the high likelihood of a successful procedure. We discussed the risks of the procedure including infection, bleeding, tissue injury, clip migration, and inadequate sampling. Informed written consent was given. The usual time out protocol was performed immediately prior to the procedure. Using sterile technique and 1% Lidocaine as local anesthetic, under stereotactic guidance, a 9 gauge vacuum assisted device was used to perform  core needle biopsy of calcifications in the upper inner quadrant of the right breast using a superior approach. Specimen radiograph was performed showing calcifications. Specimens with calcifications are identified for pathology. Lesion quadrant: Upper inner right breast At the conclusion of the procedure, a coil shaped tissue marker clip was deployed into the biopsy cavity. Follow-up 2-view mammogram was performed and dictated separately. IMPRESSION: Stereotactic-guided biopsy of upper inner right breast calcifications. No apparent complications. Electronically Signed: By: Margarette Canada M.D. On: 06/06/2017 09:26       IMPRESSION/PLAN: 1. 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 Dr. Lisbeth Renshaw discusses the pathology findings and reviews the nature of non invasive breast disease. The consensus from the breast conference include breast conservation with lumpectomy. The patient's course would then be followed  by external radiotherapy to the breast followed by close surveillance. We discussed the risks, benefits, short, and long term effects of radiotherapy, and the patient is interested in proceeding. Dr. Lisbeth Renshaw discusses the delivery and logistics of radiotherapy and anticipates a course of 4 weeks. We will see her back about 2 weeks after surgery to move forward with the simulation and planning process and anticipate starting radiotherapy about 4 weeks after surgery.  2. Possible genetic predisposition to malignancy. The patient's personal and family history would indicate a role to meet with genetics. She will consider this and was offered referral. She'd like to let us know once she's ready to proceed with this.   The above documentation reflects my direct findings during this shared patient visit. Please see the separate note by Dr. Lisbeth Renshaw on this date for the remainder of the patient's plan of care.    Carola Rhine, PAC

## 2017-06-22 NOTE — Progress Notes (Signed)
Pt was arrived to [place], but did not want to stay and finish the consultation with me.  She had a long day this morning, was seen by radiation oncologist with Dr. Lisbeth Renshaw.  Refused vital signs to be taken again here.  I briefly spoke with her husband.  We discussed that she would not need antiestrogen therapy if her surgical pathology showed DCIS, ER/PR negative.    She knows to call us if she wants to see me after her surgery.  I will cancel her consultation today.   Truitt Merle  06/22/2017   This encounter was created in error - please disregard.

## 2017-06-28 ENCOUNTER — Encounter (HOSPITAL_BASED_OUTPATIENT_CLINIC_OR_DEPARTMENT_OTHER): Payer: Self-pay | Admitting: *Deleted

## 2017-06-28 ENCOUNTER — Other Ambulatory Visit: Payer: Self-pay

## 2017-06-30 ENCOUNTER — Encounter: Payer: Self-pay | Admitting: Radiation Oncology

## 2017-07-01 ENCOUNTER — Ambulatory Visit
Admission: RE | Admit: 2017-07-01 | Discharge: 2017-07-01 | Disposition: A | Discharge status: Home / Self Care | Payer: Medicare HMO | Source: Ambulatory Visit | Attending: General Surgery | Admitting: General Surgery

## 2017-07-01 DIAGNOSIS — R921 Mammographic calcification found on diagnostic imaging of breast: Secondary | ICD-10-CM | POA: Diagnosis not present

## 2017-07-01 DIAGNOSIS — D0511 Intraductal carcinoma in situ of right breast: Secondary | ICD-10-CM

## 2017-07-01 NOTE — Progress Notes (Signed)
Pt in to pick up pre op drink. Instructions reviewed.

## 2017-07-04 ENCOUNTER — Other Ambulatory Visit: Payer: Self-pay

## 2017-07-04 ENCOUNTER — Ambulatory Visit (HOSPITAL_BASED_OUTPATIENT_CLINIC_OR_DEPARTMENT_OTHER)
Admission: RE | Admit: 2017-07-04 | Discharge: 2017-07-04 | Disposition: A | Discharge status: Home / Self Care | Payer: Medicare HMO | Source: Ambulatory Visit | Attending: General Surgery | Admitting: General Surgery

## 2017-07-04 ENCOUNTER — Encounter (HOSPITAL_BASED_OUTPATIENT_CLINIC_OR_DEPARTMENT_OTHER)
Admission: RE | Disposition: A | Discharge status: Home / Self Care | Payer: Self-pay | Source: Ambulatory Visit | Attending: General Surgery

## 2017-07-04 ENCOUNTER — Ambulatory Visit (HOSPITAL_BASED_OUTPATIENT_CLINIC_OR_DEPARTMENT_OTHER): Payer: Medicare HMO | Admitting: Certified Registered"

## 2017-07-04 ENCOUNTER — Ambulatory Visit
Admission: RE | Admit: 2017-07-04 | Discharge: 2017-07-04 | Disposition: A | Discharge status: Home / Self Care | Payer: Medicare HMO | Source: Ambulatory Visit | Attending: General Surgery | Admitting: General Surgery

## 2017-07-04 ENCOUNTER — Encounter (HOSPITAL_BASED_OUTPATIENT_CLINIC_OR_DEPARTMENT_OTHER): Payer: Self-pay

## 2017-07-04 DIAGNOSIS — D0511 Intraductal carcinoma in situ of right breast: Secondary | ICD-10-CM

## 2017-07-04 DIAGNOSIS — Z791 Long term (current) use of non-steroidal anti-inflammatories (NSAID): Secondary | ICD-10-CM | POA: Diagnosis not present

## 2017-07-04 DIAGNOSIS — R928 Other abnormal and inconclusive findings on diagnostic imaging of breast: Secondary | ICD-10-CM | POA: Diagnosis not present

## 2017-07-04 DIAGNOSIS — Z79899 Other long term (current) drug therapy: Secondary | ICD-10-CM | POA: Diagnosis not present

## 2017-07-04 DIAGNOSIS — G43909 Migraine, unspecified, not intractable, without status migrainosus: Secondary | ICD-10-CM | POA: Diagnosis not present

## 2017-07-04 DIAGNOSIS — Z90710 Acquired absence of both cervix and uterus: Secondary | ICD-10-CM | POA: Diagnosis not present

## 2017-07-04 DIAGNOSIS — M1990 Unspecified osteoarthritis, unspecified site: Secondary | ICD-10-CM | POA: Insufficient documentation

## 2017-07-04 DIAGNOSIS — Z8249 Family history of ischemic heart disease and other diseases of the circulatory system: Secondary | ICD-10-CM | POA: Diagnosis not present

## 2017-07-04 DIAGNOSIS — Z803 Family history of malignant neoplasm of breast: Secondary | ICD-10-CM | POA: Insufficient documentation

## 2017-07-04 DIAGNOSIS — Z9889 Other specified postprocedural states: Secondary | ICD-10-CM | POA: Insufficient documentation

## 2017-07-04 DIAGNOSIS — R0789 Other chest pain: Secondary | ICD-10-CM | POA: Diagnosis not present

## 2017-07-04 DIAGNOSIS — Z7982 Long term (current) use of aspirin: Secondary | ICD-10-CM | POA: Insufficient documentation

## 2017-07-04 DIAGNOSIS — E785 Hyperlipidemia, unspecified: Secondary | ICD-10-CM | POA: Diagnosis not present

## 2017-07-04 HISTORY — DX: Unspecified osteoarthritis, unspecified site: M19.90

## 2017-07-04 HISTORY — PX: BREAST LUMPECTOMY WITH RADIOACTIVE SEED LOCALIZATION: SHX6424

## 2017-07-04 HISTORY — DX: Allergy status to unspecified drugs, medicaments and biological substances: Z88.9

## 2017-07-04 SURGERY — BREAST LUMPECTOMY WITH RADIOACTIVE SEED LOCALIZATION
Anesthesia: General | Site: Breast | Laterality: Right

## 2017-07-04 MED ORDER — EPHEDRINE 5 MG/ML INJ
INTRAVENOUS | Status: AC
  Filled 2017-07-04: qty 10

## 2017-07-04 MED ORDER — MIDAZOLAM HCL 2 MG/2ML IJ SOLN
INTRAMUSCULAR | Status: AC
  Filled 2017-07-04: qty 2

## 2017-07-04 MED ORDER — FENTANYL CITRATE (PF) 100 MCG/2ML IJ SOLN
INTRAMUSCULAR | Status: DC | PRN
  Administered 2017-07-04 (×4): 25 ug via INTRAVENOUS

## 2017-07-04 MED ORDER — HYDROCODONE-ACETAMINOPHEN 5-325 MG PO TABS: 1.0000 | ORAL_TABLET | Freq: Four times a day (QID) | ORAL | 0 refills | Status: DC | PRN

## 2017-07-04 MED ORDER — FENTANYL CITRATE (PF) 100 MCG/2ML IJ SOLN
25.0000 ug | INTRAMUSCULAR | Status: DC | PRN
  Administered 2017-07-04 (×2): 25 ug via INTRAVENOUS

## 2017-07-04 MED ORDER — LIDOCAINE 2% (20 MG/ML) 5 ML SYRINGE
INTRAMUSCULAR | Status: AC
  Filled 2017-07-04: qty 5

## 2017-07-04 MED ORDER — MIDAZOLAM HCL 2 MG/2ML IJ SOLN: 1.0000 mg | INTRAMUSCULAR | Status: DC | PRN

## 2017-07-04 MED ORDER — DEXAMETHASONE SODIUM PHOSPHATE 10 MG/ML IJ SOLN
INTRAMUSCULAR | Status: AC
Start: 2017-07-04 — End: ?
  Filled 2017-07-04: qty 1

## 2017-07-04 MED ORDER — CELECOXIB 200 MG PO CAPS
200.0000 mg | ORAL_CAPSULE | ORAL | Status: AC
  Administered 2017-07-04: 200 mg via ORAL

## 2017-07-04 MED ORDER — LIDOCAINE 2% (20 MG/ML) 5 ML SYRINGE
INTRAMUSCULAR | Status: DC | PRN
Start: 2017-07-04 — End: 2017-07-04
  Administered 2017-07-04: 60 mg via INTRAVENOUS

## 2017-07-04 MED ORDER — CHLORHEXIDINE GLUCONATE CLOTH 2 % EX PADS: 6.0000 | MEDICATED_PAD | Freq: Once | CUTANEOUS | Status: DC

## 2017-07-04 MED ORDER — BUPIVACAINE-EPINEPHRINE (PF) 0.25% -1:200000 IJ SOLN
INTRAMUSCULAR | Status: DC | PRN
  Administered 2017-07-04: 30 mL via PERINEURAL

## 2017-07-04 MED ORDER — OXYCODONE HCL 5 MG/5ML PO SOLN: 5.0000 mg | Freq: Once | ORAL | Status: DC | PRN

## 2017-07-04 MED ORDER — MIDAZOLAM HCL 5 MG/5ML IJ SOLN
INTRAMUSCULAR | Status: DC | PRN
  Administered 2017-07-04: 2 mg via INTRAVENOUS

## 2017-07-04 MED ORDER — FENTANYL CITRATE (PF) 100 MCG/2ML IJ SOLN
INTRAMUSCULAR | Status: AC
  Filled 2017-07-04: qty 2

## 2017-07-04 MED ORDER — SCOPOLAMINE 1 MG/3DAYS TD PT72: 1.0000 | MEDICATED_PATCH | Freq: Once | TRANSDERMAL | Status: DC | PRN

## 2017-07-04 MED ORDER — CELECOXIB 200 MG PO CAPS
ORAL_CAPSULE | ORAL | Status: AC
  Filled 2017-07-04: qty 1

## 2017-07-04 MED ORDER — PROPOFOL 10 MG/ML IV BOLUS
INTRAVENOUS | Status: AC
  Filled 2017-07-04: qty 20

## 2017-07-04 MED ORDER — DEXAMETHASONE SODIUM PHOSPHATE 10 MG/ML IJ SOLN
INTRAMUSCULAR | Status: DC | PRN
  Administered 2017-07-04: 10 mg via INTRAVENOUS

## 2017-07-04 MED ORDER — OXYCODONE HCL 5 MG PO TABS: 5.0000 mg | ORAL_TABLET | Freq: Once | ORAL | Status: DC | PRN

## 2017-07-04 MED ORDER — FENTANYL CITRATE (PF) 100 MCG/2ML IJ SOLN: 50.0000 ug | INTRAMUSCULAR | Status: DC | PRN

## 2017-07-04 MED ORDER — PROPOFOL 10 MG/ML IV BOLUS
INTRAVENOUS | Status: DC | PRN
  Administered 2017-07-04: 200 mg via INTRAVENOUS

## 2017-07-04 MED ORDER — GABAPENTIN 300 MG PO CAPS
300.0000 mg | ORAL_CAPSULE | ORAL | Status: AC
  Administered 2017-07-04: 300 mg via ORAL

## 2017-07-04 MED ORDER — ONDANSETRON HCL 4 MG/2ML IJ SOLN
INTRAMUSCULAR | Status: AC
  Filled 2017-07-04: qty 2

## 2017-07-04 MED ORDER — GABAPENTIN 300 MG PO CAPS
ORAL_CAPSULE | ORAL | Status: AC
  Filled 2017-07-04: qty 1

## 2017-07-04 MED ORDER — LACTATED RINGERS IV SOLN
INTRAVENOUS | Status: DC
  Administered 2017-07-04 (×3): via INTRAVENOUS

## 2017-07-04 MED ORDER — ACETAMINOPHEN 500 MG PO TABS
ORAL_TABLET | ORAL | Status: AC
  Filled 2017-07-04: qty 2

## 2017-07-04 MED ORDER — ACETAMINOPHEN 500 MG PO TABS
1000.0000 mg | ORAL_TABLET | ORAL | Status: AC
  Administered 2017-07-04: 1000 mg via ORAL

## 2017-07-04 MED ORDER — EPHEDRINE SULFATE-NACL 50-0.9 MG/10ML-% IV SOSY
PREFILLED_SYRINGE | INTRAVENOUS | Status: DC | PRN
  Administered 2017-07-04: 10 mg via INTRAVENOUS

## 2017-07-04 MED ORDER — KETOROLAC TROMETHAMINE 30 MG/ML IJ SOLN: 30.0000 mg | Freq: Once | INTRAMUSCULAR | Status: DC | PRN

## 2017-07-04 MED ORDER — ONDANSETRON HCL 4 MG/2ML IJ SOLN
INTRAMUSCULAR | Status: DC | PRN
  Administered 2017-07-04: 4 mg via INTRAVENOUS

## 2017-07-04 MED ORDER — CEFAZOLIN SODIUM-DEXTROSE 2-4 GM/100ML-% IV SOLN
INTRAVENOUS | Status: AC
  Filled 2017-07-04: qty 100

## 2017-07-04 MED ORDER — PROMETHAZINE HCL 25 MG/ML IJ SOLN: 6.2500 mg | INTRAMUSCULAR | Status: DC | PRN

## 2017-07-04 MED ORDER — CEFAZOLIN SODIUM-DEXTROSE 2-4 GM/100ML-% IV SOLN
2.0000 g | INTRAVENOUS | Status: AC
  Administered 2017-07-04: 2 g via INTRAVENOUS

## 2017-07-04 SURGICAL SUPPLY — 45 items
APPLIER CLIP 9.375 MED OPEN (MISCELLANEOUS) ×3
BLADE SURG 15 STRL LF DISP TIS (BLADE) ×1 IMPLANT
BLADE SURG 15 STRL SS (BLADE) ×2
CANISTER SUC SOCK COL 7IN (MISCELLANEOUS) ×3 IMPLANT
CANISTER SUCT 1200ML W/VALVE (MISCELLANEOUS) ×3 IMPLANT
CHLORAPREP W/TINT 26ML (MISCELLANEOUS) ×3 IMPLANT
CLIP APPLIE 9.375 MED OPEN (MISCELLANEOUS) ×1 IMPLANT
COVER BACK TABLE 60X90IN (DRAPES) ×3 IMPLANT
COVER MAYO STAND STRL (DRAPES) ×3 IMPLANT
COVER PROBE W GEL 5X96 (DRAPES) ×3 IMPLANT
DECANTER SPIKE VIAL GLASS SM (MISCELLANEOUS) IMPLANT
DERMABOND ADVANCED (GAUZE/BANDAGES/DRESSINGS) ×2
DERMABOND ADVANCED .7 DNX12 (GAUZE/BANDAGES/DRESSINGS) ×1 IMPLANT
DEVICE DUBIN W/COMP PLATE 8390 (MISCELLANEOUS) ×3 IMPLANT
DRAPE LAPAROSCOPIC ABDOMINAL (DRAPES) ×3 IMPLANT
DRAPE UTILITY XL STRL (DRAPES) ×3 IMPLANT
ELECT COATED BLADE 2.86 ST (ELECTRODE) ×3 IMPLANT
ELECT REM PT RETURN 9FT ADLT (ELECTROSURGICAL) ×3
ELECTRODE REM PT RTRN 9FT ADLT (ELECTROSURGICAL) ×1 IMPLANT
GLOVE BIO SURGEON STRL SZ7.5 (GLOVE) ×6 IMPLANT
GLOVE BIOGEL M STRL SZ7.5 (GLOVE) ×6 IMPLANT
GLOVE BIOGEL PI IND STRL 8 (GLOVE) ×2 IMPLANT
GLOVE BIOGEL PI INDICATOR 8 (GLOVE) ×4
GOWN STRL REUS W/ TWL LRG LVL3 (GOWN DISPOSABLE) ×2 IMPLANT
GOWN STRL REUS W/ TWL XL LVL3 (GOWN DISPOSABLE) ×1 IMPLANT
GOWN STRL REUS W/TWL LRG LVL3 (GOWN DISPOSABLE) ×4
GOWN STRL REUS W/TWL XL LVL3 (GOWN DISPOSABLE) ×2
ILLUMINATOR WAVEGUIDE N/F (MISCELLANEOUS) IMPLANT
KIT MARKER MARGIN INK (KITS) ×3 IMPLANT
LIGHT WAVEGUIDE WIDE FLAT (MISCELLANEOUS) IMPLANT
NEEDLE HYPO 25X1 1.5 SAFETY (NEEDLE) ×3 IMPLANT
NS IRRIG 1000ML POUR BTL (IV SOLUTION) IMPLANT
PACK BASIN DAY SURGERY FS (CUSTOM PROCEDURE TRAY) ×3 IMPLANT
PENCIL BUTTON HOLSTER BLD 10FT (ELECTRODE) ×3 IMPLANT
SLEEVE SCD COMPRESS KNEE MED (MISCELLANEOUS) ×3 IMPLANT
SPONGE LAP 18X18 X RAY DECT (DISPOSABLE) ×3 IMPLANT
SUT MON AB 4-0 PC3 18 (SUTURE) ×3 IMPLANT
SUT SILK 2 0 SH (SUTURE) IMPLANT
SUT VICRYL 3-0 CR8 SH (SUTURE) ×3 IMPLANT
SYR CONTROL 10ML LL (SYRINGE) IMPLANT
TOWEL OR 17X24 6PK STRL BLUE (TOWEL DISPOSABLE) ×3 IMPLANT
TOWEL OR NON WOVEN STRL DISP B (DISPOSABLE) ×3 IMPLANT
TUBE CONNECTING 20'X1/4 (TUBING) ×1
TUBE CONNECTING 20X1/4 (TUBING) ×2 IMPLANT
YANKAUER SUCT BULB TIP NO VENT (SUCTIONS) ×3 IMPLANT

## 2017-07-04 NOTE — Interval H&P Note (Signed)
History and Physical Interval Note:  07/04/2017 2:20 PM  Caroline Wilson  has presented today for surgery, with the diagnosis of RIGHT BREAST DCIS  The various methods of treatment have been discussed with the patient and family. After consideration of risks, benefits and other options for treatment, the patient has consented to  Procedure(s): BREAST LUMPECTOMY WITH RADIOACTIVE SEED LOCALIZATION (Right) as a surgical intervention .  The patient's history has been reviewed, patient examined, no change in status, stable for surgery.  I have reviewed the patient's chart and labs.  Questions were answered to the patient's satisfaction.     TOTH III,Latron Ribas S

## 2017-07-04 NOTE — Transfer of Care (Signed)
Immediate Anesthesia Transfer of Care Note  Patient: Caroline Wilson  Procedure(s) Performed: BREAST LUMPECTOMY WITH RADIOACTIVE SEED LOCALIZATION (Right Breast)  Patient Location: PACU  Anesthesia Type:General  Level of Consciousness: drowsy and patient cooperative  Airway & Oxygen Therapy: Patient Spontanous Breathing and Patient connected to face mask oxygen  Post-op Assessment: Report given to RN and Post -op Vital signs reviewed and stable  Post vital signs: Reviewed and stable  Last Vitals:  Vitals:   07/04/17 1230  BP: (!) 143/63  Pulse: 71  Resp: 18  Temp: 36.9 C  SpO2: 100%    Last Pain:  Vitals:   07/04/17 1230  TempSrc: Oral         Complications: No apparent anesthesia complications

## 2017-07-04 NOTE — Anesthesia Preprocedure Evaluation (Signed)
Anesthesia Evaluation  Patient identified by MRN, date of birth, ID band Patient awake    Reviewed: Allergy & Precautions, NPO status , Patient's Chart, lab work & pertinent test results  Airway Mallampati: II  TM Distance: >3 FB Neck ROM: Full    Dental no notable dental hx.    Pulmonary neg pulmonary ROS,    Pulmonary exam normal breath sounds clear to auscultation       Cardiovascular negative cardio ROS Normal cardiovascular exam Rhythm:Regular Rate:Normal     Neuro/Psych negative neurological ROS  negative psych ROS   GI/Hepatic negative GI ROS, Neg liver ROS,   Endo/Other  negative endocrine ROS  Renal/GU negative Renal ROS  negative genitourinary   Musculoskeletal negative musculoskeletal ROS (+)   Abdominal   Peds negative pediatric ROS (+)  Hematology negative hematology ROS (+)   Anesthesia Other Findings   Reproductive/Obstetrics negative OB ROS                             Anesthesia Physical Anesthesia Plan  ASA: II  Anesthesia Plan: General   Post-op Pain Management:    Induction: Intravenous  PONV Risk Score and Plan: 2 and Ondansetron, Dexamethasone and Treatment may vary due to age or medical condition  Airway Management Planned: LMA  Additional Equipment:   Intra-op Plan:   Post-operative Plan: Extubation in OR  Informed Consent: I have reviewed the patients History and Physical, chart, labs and discussed the procedure including the risks, benefits and alternatives for the proposed anesthesia with the patient or authorized representative who has indicated his/her understanding and acceptance.     Dental advisory given  Plan Discussed with: CRNA and Surgeon  Anesthesia Plan Comments:         Anesthesia Quick Evaluation  

## 2017-07-04 NOTE — Op Note (Signed)
07/04/2017  3:38 PM  PATIENT:  Caroline Wilson  71 y.o. female  PRE-OPERATIVE DIAGNOSIS:  RIGHT BREAST DCIS  POST-OPERATIVE DIAGNOSIS:  RIGHT BREAST DCIS  PROCEDURE:  Procedure(s): BREAST LUMPECTOMY WITH RADIOACTIVE SEED LOCALIZATION (Right)  SURGEON:  Surgeon(s) and Role:    Jovita Kussmaul, MD - Primary  PHYSICIAN ASSISTANT:   ASSISTANTS: none   ANESTHESIA:   local and general  EBL:  5 mL   BLOOD ADMINISTERED:none  DRAINS: none   LOCAL MEDICATIONS USED:  MARCAINE     SPECIMEN:  Source of Specimen:  right breast tissue  DISPOSITION OF SPECIMEN:  PATHOLOGY  COUNTS:  YES  TOURNIQUET:  * No tourniquets in log *  DICTATION: .Dragon Dictation   After informed consent was obtained the patient was brought to the operating room and placed in the supine position on the operating table. After adequate induction of general anesthesia the patient's right wrist was prepped with ChloraPrep, allowed to dry, and draped in usual sterile manner. An appropriate timeout was performed. Previously 2 I-125 seeds were placed in the upper portion of the right breast to bracket an area of ductal carcinoma in situ. The neoprobe was set to I-125 and the area of radioactivity was readily identified in the upper inner right breast. In the same area there was a firm mass which also measured about 3-4 cm and there did not appear to be much separation between the palpable mass in the skin. For this reason I decided to make an elliptical incision overlying the palpable mass with a 15 blade knife. The incision was carried through the skin and subcutaneous tissue sharply with electrocautery. The dissection was then carried down around the 2 radioactive seeds and all the way to the chest wall. Once the specimen was removed it was oriented with the appropriate paint colors. A specimen radiograph was obtained that showed the clip and both seeds to be within the specimen along with the calcifications. The specimen  was then sent to pathology for further evaluation. Hemostasis was achieved using the Bovie electrocautery. The wound was infiltrated with more quarter percent Marcaine and irrigated with saline. The margins of the cavity was marked with clips. The deep layer of the wound was then closed with layers of interrupted 3-0 Vicryl stitches. The skin was then closed with a running 4-0 Monocryl subcuticular stitch. Dermabond dressings were applied. The patient tolerated the procedure well. At the end of the case all needle sponge and is to counts were correct. The patient was then awakened and taken to recovery in stable condition.  PLAN OF CARE: Discharge to home after PACU  PATIENT DISPOSITION:  PACU - hemodynamically stable.   Delay start of Pharmacological VTE agent (>24hrs) due to surgical blood loss or risk of bleeding: not applicable

## 2017-07-04 NOTE — H&P (Signed)
Elder Love  Location: [facility] Patient #: [id number] DOB: 12-01-1945 Undefined / Language: Cleophus Molt / Race: Black or African American Female   History of Present Illness The patient is a 71 year old female who presents with breast cancer. We are asked to see the patient in consultation by Dr. Leighton Ruff to evaluate her for a new right breast cancer. The patient is a 71 year old black female who recently went for a routine screening mammogram. At that time she was found to have a 2.8 cm area of abnormal calcification in the upper inner right breast. 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 It was estrogen and progesterone receptor negative. She denies any breast pain or discharge from the nipple. She did develop a sizable hematoma after the biopsy. She does have a family history of breast cancer in her sister and 2 aunts. She does not smoke. She does not take hormone replacement.   Past Surgical History Breast Biopsy  Right. Hysterectomy (not due to cancer) - Complete   Diagnostic Studies History  Colonoscopy  1-5 years ago Mammogram  within last year Pap Smear  1-5 years ago  Allergies  No Known Drug Allergies  Allergies Reconciled   Medication History  Aspirin (81MG  Tablet, Oral) Active. Simvastatin (40MG  Tablet, Oral) Active. SUMAtriptan Succinate (25MG  Tablet, Oral) Active. Medications Reconciled  Social History  Alcohol use  Occasional alcohol use. Caffeine use  Coffee. No drug use  Tobacco use  Never smoker.  Family History  Breast Cancer  Sister. Heart Disease  Mother.  Pregnancy / Birth History  Age at menarche  56 years. Age of menopause  44-55 Gravida  3 Maternal age  19-25 Para  3  Other Problems  Arthritis  Back Pain  Chest pain  Hypercholesterolemia  Migraine Headache     Review of Systems  General Not Present- Appetite Loss, Chills, Fatigue, Fever, Night Sweats, Weight  Gain and Weight Loss. Skin Not Present- Change in Wart/Mole, Dryness, Hives, Jaundice, New Lesions, Non-Healing Wounds, Rash and Ulcer. HEENT Present- Wears glasses/contact lenses. Not Present- Earache, Hearing Loss, Hoarseness, Nose Bleed, Oral Ulcers, Ringing in the Ears, Seasonal Allergies, Sinus Pain, Sore Throat, Visual Disturbances and Yellow Eyes. Respiratory Not Present- Bloody sputum, Chronic Cough, Difficulty Breathing, Snoring and Wheezing. Cardiovascular Present- Leg Cramps. Not Present- Chest Pain, Difficulty Breathing Lying Down, Palpitations, Rapid Heart Rate, Shortness of Breath and Swelling of Extremities. Gastrointestinal Not Present- Abdominal Pain, Bloating, Bloody Stool, Change in Bowel Habits, Chronic diarrhea, Constipation, Difficulty Swallowing, Excessive gas, Gets full quickly at meals, Hemorrhoids, Indigestion, Nausea, Rectal Pain and Vomiting. Female Genitourinary Not Present- Frequency, Nocturia, Painful Urination, Pelvic Pain and Urgency. Musculoskeletal Present- Back Pain and Joint Stiffness. Not Present- Joint Pain, Muscle Pain, Muscle Weakness and Swelling of Extremities. Neurological Present- Headaches. Not Present- Decreased Memory, Fainting, Numbness, Seizures, Tingling, Tremor, Trouble walking and Weakness. Psychiatric Not Present- Anxiety, Bipolar, Change in Sleep Pattern, Depression, Fearful and Frequent crying. Endocrine Present- Hot flashes. Not Present- Cold Intolerance, Excessive Hunger, Hair Changes, Heat Intolerance and New Diabetes. Hematology Not Present- Blood Thinners, Easy Bruising, Excessive bleeding, Gland problems, HIV and Persistent Infections.  Vitals  Weight: 180.2 lb Height: 63in Body Surface Area: 1.85 m Body Mass Index: 31.92 kg/m  Temp.: 97.14F  Pulse: 81 (Regular)  BP: 142/82 (Sitting, Left Arm, Standard)       Physical Exam  General Mental Status-Alert. General Appearance-Consistent with stated  age. Hydration-Well hydrated. Voice-Normal.  Head and Neck Head-normocephalic, atraumatic  with no lesions or palpable masses. Trachea-midline. Thyroid Gland Characteristics - normal size and consistency.  Eye Eyeball - Bilateral-Extraocular movements intact. Sclera/Conjunctiva - Bilateral-No scleral icterus.  Chest and Lung Exam Chest and lung exam reveals -quiet, even and easy respiratory effort with no use of accessory muscles and on auscultation, normal breath sounds, no adventitious sounds and normal vocal resonance. Inspection Chest Wall - Normal. Back - normal.  Breast Note: There is a palpable 4-5 cm hematoma in the upper inner right breast. There is no palpable mass in the left breast. There is no palpable axillary, supraclavicular, or cervical lymphadenopathy.   Cardiovascular Cardiovascular examination reveals -normal heart sounds, regular rate and rhythm with no murmurs and normal pedal pulses bilaterally.  Abdomen Inspection Inspection of the abdomen reveals - No Hernias. Skin - Scar - no surgical scars. Palpation/Percussion Palpation and Percussion of the abdomen reveal - Soft, Non Tender, No Rebound tenderness, No Rigidity (guarding) and No hepatosplenomegaly. Auscultation Auscultation of the abdomen reveals - Bowel sounds normal.  Neurologic Neurologic evaluation reveals -alert and oriented x 3 with no impairment of recent or remote memory. Mental Status-Normal.  Musculoskeletal Normal Exam - Left-Upper Extremity Strength Normal and Lower Extremity Strength Normal. Normal Exam - Right-Upper Extremity Strength Normal and Lower Extremity Strength Normal.  Lymphatic Head & Neck  General Head & Neck Lymphatics: Bilateral - Description - Normal. Axillary  General Axillary Region: Bilateral - Description - Normal. Tenderness - Non Tender. Femoral & Inguinal  Generalized Femoral & Inguinal Lymphatics: Bilateral - Description -  Normal. Tenderness - Non Tender.    Assessment & Plan  DUCTAL CARCINOMA IN SITU (DCIS) OF RIGHT BREAST (D05.11) Impression: The patient appears to have a 2.8 cm area of ductal carcinoma in situ in the upper inner right breast. I have talked her in detail about the options for treatment and at this point she favors breast conservation. I think this is a very reasonable way of treating her cancer. She will require a radioactive seed localization. I have discussed with her in detail the risks and benefits of the operation as well as some of the technical aspects and she understands and wishes to proceed. I will go ahead and refer her also to medical and radiation oncology to discuss adjuvant therapy. Current Plans Referred to Oncology, for evaluation and follow up (Oncology). Routine. Pt Education - Breast cancer: discussed with patient and provided information.

## 2017-07-04 NOTE — Discharge Instructions (Signed)

## 2017-07-04 NOTE — Anesthesia Procedure Notes (Signed)
Procedure Name: LMA Insertion Date/Time: 07/04/2017 2:39 PM Performed by: Genelle Bal, CRNA Pre-anesthesia Checklist: Patient identified, Emergency Drugs available, Suction available and Patient being monitored Patient Re-evaluated:Patient Re-evaluated prior to induction Oxygen Delivery Method: Circle system utilized Preoxygenation: Pre-oxygenation with 100% oxygen Induction Type: IV induction Ventilation: Mask ventilation without difficulty LMA: LMA inserted LMA Size: 4.0 Number of attempts: 1 Airway Equipment and Method: Bite block Placement Confirmation: positive ETCO2 Tube secured with: Tape Dental Injury: Teeth and Oropharynx as per pre-operative assessment

## 2017-07-05 ENCOUNTER — Encounter (HOSPITAL_BASED_OUTPATIENT_CLINIC_OR_DEPARTMENT_OTHER): Payer: Self-pay | Admitting: General Surgery

## 2017-07-06 NOTE — Anesthesia Postprocedure Evaluation (Signed)
Anesthesia Post Note  Patient: Caroline Wilson  Procedure(s) Performed: BREAST LUMPECTOMY WITH RADIOACTIVE SEED LOCALIZATION (Right Breast)     Patient location during evaluation: PACU Anesthesia Type: General Level of consciousness: awake and alert Pain management: pain level controlled Vital Signs Assessment: post-procedure vital signs reviewed and stable Respiratory status: spontaneous breathing, nonlabored ventilation, respiratory function stable and patient connected to nasal cannula oxygen Cardiovascular status: blood pressure returned to baseline and stable Postop Assessment: no apparent nausea or vomiting Anesthetic complications: no    Last Vitals:  Vitals:   07/04/17 1645 07/04/17 1700  BP: 131/78 (!) 147/71  Pulse: (!) 58 62  Resp: 15 16  Temp:  (!) 36.4 C  SpO2: 100% 100%    Last Pain:  Vitals:   07/04/17 1700  TempSrc:   PainSc: 2                  Anivea Velasques S

## 2017-07-11 DIAGNOSIS — C50919 Malignant neoplasm of unspecified site of unspecified female breast: Secondary | ICD-10-CM | POA: Diagnosis not present

## 2017-07-11 DIAGNOSIS — E7800 Pure hypercholesterolemia, unspecified: Secondary | ICD-10-CM | POA: Diagnosis not present

## 2017-07-13 ENCOUNTER — Ambulatory Visit: Payer: Self-pay | Admitting: General Surgery

## 2017-07-20 ENCOUNTER — Encounter (HOSPITAL_BASED_OUTPATIENT_CLINIC_OR_DEPARTMENT_OTHER): Payer: Self-pay | Admitting: *Deleted

## 2017-07-20 ENCOUNTER — Other Ambulatory Visit: Payer: Self-pay

## 2017-07-25 ENCOUNTER — Ambulatory Visit (HOSPITAL_BASED_OUTPATIENT_CLINIC_OR_DEPARTMENT_OTHER): Admission: RE | Admit: 2017-07-25 | Payer: Medicare HMO | Source: Ambulatory Visit | Admitting: General Surgery

## 2017-07-25 SURGERY — EXCISION, LESION, BREAST
Anesthesia: General | Site: Breast | Laterality: Right

## 2017-07-25 NOTE — Progress Notes (Signed)
Location of Breast Cancer: Upper Inner breast quadrant of right breast of female   FUN after 07-04-17 surgery to move forward with the simulation and planning process.    Histology per Pathology Report: Diagnosis  07-04-17 Breast, lumpectomy, Right w/seed - HIGH GRADE DUCTAL CARCINOMA IN SITU, SPANNING APPROXIMATELY 2.4 CM. - IN SITU CARCINOMA COMES TO WITHIN <0.1 CM OF THE INFERIOR MARGIN MULTIFOCALLY, AND 0.2-0.3 CM OF THE MEDIAL MARGIN FOCALLY. - BIOPSY SITE. - SEE ONCOLOGY TABLE. Receptor Status: ER(0%neg), PR (0%neg), Her2-neu (), Ki-()   Diagnosis 06/06/17: Breast, right, needle core biopsy, upper inner right breast - DUCTAL CARCINOMA IN SITU WITH NECROSIS AND CALCIFICATIONS  Receptor Status: ER(0%neg), PR (0%neg), Her2-neu (), Ki-()  Did patient present with symptoms (if so, please note symptoms) or was this found on screening mammography?: Routine mammogram  Past/Anticipated interventions by surgeon, if any: Dr. Autumn Messing 07-04-17 Breast, lumpectomy, Right w/seed,Dr. Autumn Messing, MD, dvelopded hematoma after bx, ,   07/04/17  Lumpectomy  Scheduled   Past/Anticipated interventions by medical oncology, if any: Chemotherapy :Dr. Burr Medico, MD 06/22/17 at 11:00am  Lymphedema issues, if any:     Pain issues, if any:07-28-17                                             HX  back, chest pain,migraines, pain in right breast from bx, takes tylenol e.s. prn  SAFETY ISSUES: NO  Prior radiation? No  Pacemaker/ICD?  No  Possible current pregnancy? No  Is the patient on methotrexate? No  Current Complaints / other details:  Menarche age 71, G49P3   Hx breast cancer in sister and  2 aunts, 1 paternal aunt, the other maternal aunt,  Mother heart disease,  BP 135/77   Pulse 67   Temp 98.5 F (36.9 C) (Oral)   Resp 20   Ht '5\' 3"'  (1.6 m)   Wt 180 lb 6.4 oz (81.8 kg)   BMI 31.96 kg/m   Wt Readings from Last 3 Encounters:  07/28/17 180 lb 6.4 oz (81.8 kg)  07/04/17 188 lb  (85.3 kg)  06/22/17 180 lb 12.8 oz (82 kg)

## 2017-07-28 ENCOUNTER — Ambulatory Visit
Admission: RE | Admit: 2017-07-28 | Discharge: 2017-07-28 | Disposition: A | Discharge status: Home / Self Care | Payer: Medicare HMO | Source: Ambulatory Visit | Attending: Radiation Oncology | Admitting: Radiation Oncology

## 2017-07-28 ENCOUNTER — Encounter: Payer: Self-pay | Admitting: Radiation Oncology

## 2017-07-28 ENCOUNTER — Ambulatory Visit: Admission: RE | Admit: 2017-07-28 | Payer: Medicare HMO | Source: Ambulatory Visit | Admitting: Radiation Oncology

## 2017-07-28 VITALS — BP 135/77 | HR 67 | Temp 98.5°F | Resp 20 | Ht 63.0 in | Wt 180.4 lb

## 2017-07-28 DIAGNOSIS — C50211 Malignant neoplasm of upper-inner quadrant of right female breast: Secondary | ICD-10-CM

## 2017-07-28 DIAGNOSIS — Z170 Estrogen receptor positive status [ER+]: Principal | ICD-10-CM

## 2017-07-28 DIAGNOSIS — Z90710 Acquired absence of both cervix and uterus: Secondary | ICD-10-CM | POA: Diagnosis not present

## 2017-07-28 DIAGNOSIS — D0511 Intraductal carcinoma in situ of right breast: Secondary | ICD-10-CM | POA: Diagnosis not present

## 2017-07-28 DIAGNOSIS — Z171 Estrogen receptor negative status [ER-]: Secondary | ICD-10-CM | POA: Diagnosis not present

## 2017-07-28 DIAGNOSIS — Z803 Family history of malignant neoplasm of breast: Secondary | ICD-10-CM | POA: Diagnosis not present

## 2017-07-28 DIAGNOSIS — Z8249 Family history of ischemic heart disease and other diseases of the circulatory system: Secondary | ICD-10-CM | POA: Diagnosis not present

## 2017-07-28 DIAGNOSIS — Z510 Encounter for antineoplastic radiation therapy: Secondary | ICD-10-CM | POA: Diagnosis not present

## 2017-07-28 DIAGNOSIS — Z9889 Other specified postprocedural states: Secondary | ICD-10-CM | POA: Diagnosis not present

## 2017-07-28 DIAGNOSIS — Z79899 Other long term (current) drug therapy: Secondary | ICD-10-CM | POA: Diagnosis not present

## 2017-07-28 DIAGNOSIS — E7800 Pure hypercholesterolemia, unspecified: Secondary | ICD-10-CM | POA: Diagnosis not present

## 2017-07-28 MED ORDER — HYDROMORPHONE HCL 4 MG PO TABS: 4.0000 mg | ORAL_TABLET | Freq: Four times a day (QID) | ORAL | 0 refills | Status: DC | PRN

## 2017-07-28 NOTE — Addendum Note (Signed)
Encounter addended by: Doreen Beam, RN on: 07/28/2017 3:51 PM  Actions taken: Charge Capture section accepted

## 2017-07-28 NOTE — Progress Notes (Signed)
Radiation Oncology         [phone] ________________________________  Name: Caroline Wilson        MRN: [id number]  Date of Service: 07/28/2017 DOB: [birth year]  CC:Leighton Ruff, MD  Jovita Kussmaul, MD     REFERRING PHYSICIAN: Autumn Messing III, MD   DIAGNOSIS: The primary encounter diagnosis was Malignant neoplasm of upper-inner quadrant of right breast in female, estrogen receptor negative ([place]). A diagnosis of Ductal carcinoma in situ (DCIS) of right breast was also pertinent to this visit.   HISTORY OF PRESENT ILLNESS: Caroline Wilson is a 71 y.o. female originally seen at the request of Dr. Marlou Starks for a newly diagnosed right breast cancer. The patient was seen for screening mammogram which detected calcifications that measured 1.6 x 2.8 x 1.2 cm. 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 She underwent right lumpectomy on 07/04/17, which revealed high grade DCIS. The tumor came within 1 mm in multifocal sites of the inferior margin, as well as 2-3 mm from the medial margin focally.  She comes today to review the options of radiotherapy.   PREVIOUS RADIATION THERAPY: No   PAST MEDICAL HISTORY:  Past Medical History:  Diagnosis Date  . Arthritis    knees, neck  . Breast cancer ([place]) 06/06/2017   right breast DCIS  . H/O seasonal allergies   . Headache   . Hypercholesteremia   . Knee pain        PAST SURGICAL HISTORY: Past Surgical History:  Procedure Laterality Date  . ABDOMINAL HYSTERECTOMY    . BREAST BIOPSY    . BREAST LUMPECTOMY WITH RADIOACTIVE SEED LOCALIZATION Right 07/04/2017   Procedure: BREAST LUMPECTOMY WITH RADIOACTIVE SEED LOCALIZATION;  Surgeon: Jovita Kussmaul, MD;  Location: [facility];  Service: General;  Laterality: Right;  . TUBAL LIGATION       FAMILY HISTORY:  Family History  Problem Relation Age of Onset  . Heart disease Mother   . Hypertension Father   . Breast  cancer Sister   . Breast cancer Paternal Aunt   . Breast cancer Maternal Aunt      SOCIAL HISTORY:  reports that  has never smoked. she has never used smokeless tobacco. She reports that she drinks about 0.6 oz of alcohol per week. She reports that she does not use drugs. The patient is married. 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 She and her husband relocated from [place] after retirement.   ALLERGIES: Patient has no known allergies.   MEDICATIONS:  Current Outpatient Medications  Medication Sig Dispense Refill  . acetaminophen (TYLENOL) 500 MG tablet Take 500 mg by mouth every 6 (six) hours as needed.    . cholecalciferol (VITAMIN D) 1000 units tablet Take 1,000 Units by mouth daily.    . Multiple Vitamin (MULTIVITAMIN) tablet Take 1 tablet by mouth daily.    . naproxen (NAPROSYN) 500 MG tablet Take 1 tablet (500 mg total) by mouth every 6 (six) hours as needed. 20 tablet 6  . rosuvastatin (CRESTOR) 5 MG tablet Take 5 mg by mouth daily at 6 PM.    . aspirin EC 81 MG tablet Take 1 tablet (81 mg total) by mouth daily.    . calcium carbonate (TUMS - DOSED IN MG ELEMENTAL CALCIUM) 500 MG chewable tablet Chew 2 tablets daily by mouth.    . cetirizine (ZYRTEC) 10 MG tablet Take 10 mg daily by  mouth.    Marland Kitchen HYDROcodone-acetaminophen (NORCO/VICODIN) 5-325 MG tablet Take 1-2 tablets every 6 (six) hours as needed by mouth for moderate pain or severe pain. (Patient not taking: Reported on 07/28/2017) 20 tablet 0   No current facility-administered medications for this encounter.      REVIEW OF SYSTEMS: On review of systems, the patient reports that she is doing well overall. She denies any chest pain, shortness of breath, cough, fevers, chills, night sweats, unintended weight changes. She denies any bowel or bladder disturbances, and denies abdominal pain, nausea or vomiting. She denies any new musculoskeletal or joint aches or pains. A complete review of systems is obtained and is  otherwise negative.     PHYSICAL EXAM:  Wt Readings from Last 3 Encounters:  07/28/17 180 lb 6.4 oz (81.8 kg)  07/04/17 188 lb (85.3 kg)  06/22/17 180 lb 12.8 oz (82 kg)   Temp Readings from Last 3 Encounters:  07/28/17 98.5 F (36.9 C) (Oral)  07/04/17 (!) 97.5 F (36.4 C)  06/22/17 98.6 F (37 C) (Oral)   BP Readings from Last 3 Encounters:  07/28/17 135/77  07/04/17 (!) 147/71  06/22/17 140/64   Pulse Readings from Last 3 Encounters:  07/28/17 67  07/04/17 62  06/22/17 62   Pain Assessment Pain Score: 2  Pain Loc: Breast(right)/10  In general this is a well appearing African American female in no acute distress. She is alert and oriented x4 and appropriate throughout the examination. HEENT reveals that the patient is normocephalic, atraumatic. EOMs are intact. PERRLA. Skin is intact without any evidence of gross lesions. Cardiopulmonary assessment is negative for acute distress and sheexhibits normal effort. The right breast is well healed and reveals an intact lumpectomy scar. No erythema or separation is noted.      ECOG = 0  0 - Asymptomatic (Fully active, able to carry on all predisease activities without restriction)  1 - Symptomatic but completely ambulatory (Restricted in physically strenuous activity but ambulatory and able to carry out work of a light or sedentary nature. For example, light housework, office work)  2 - Symptomatic, <50% in bed during the day (Ambulatory and capable of all self care but unable to carry out any work activities. Up and about more than 50% of waking hours)  3 - Symptomatic, >50% in bed, but not bedbound (Capable of only limited self-care, confined to bed or chair 50% or more of waking hours)  4 - Bedbound (Completely disabled. Cannot carry on any self-care. Totally confined to bed or chair)  5 - Death   Eustace Pen MM, Creech RH, Tormey DC, et al. [phone]). "Toxicity and response criteria of the [facility]".  Central Oncol. 5 (6): 649-55    LABORATORY DATA:  Lab Results  Component Value Date   WBC 3.2 (L) 08/26/2014   HGB 11.7 (L) 08/26/2014   HCT 37.0 08/26/2014   MCV 88.5 08/26/2014   PLT 228 08/26/2014   Lab Results  Component Value Date   NA 139 08/26/2014   K 4.0 08/26/2014   CL 107 08/26/2014   CO2 26 08/26/2014   No results found for: ALT, AST, GGT, ALKPHOS, BILITOT    RADIOGRAPHY: Mm Breast Surgical Specimen  Result Date: 07/04/2017 CLINICAL DATA:  Post right breast lumpectomy. EXAM: SPECIMEN RADIOGRAPH OF THE RIGHT BREAST COMPARISON:  Previous exam(s). FINDINGS: Status post excision of the right breast. The 2 radioactive seeds and biopsy marker clip are present, completely intact, and were marked  for pathology. Calcifications are seen well centered within the specimen. IMPRESSION: Specimen radiograph of the right breast. Electronically Signed   By: Fidela Salisbury M.D.   On: 07/04/2017 15:17   Mm Rt Radioactive Seed Loc Mammo Guide  Result Date: 07/01/2017 CLINICAL DATA:  71 year old female for radioactive seed bracketing of right breast DCIS prior to lumpectomy. EXAM: MAMMOGRAPHIC GUIDED RADIOACTIVE SEED LOCALIZATION OF THE RIGHT BREAST X 2 COMPARISON:  Previous exam(s). FINDINGS: Patient presents for radioactive seed localization/bracketing prior to right lumpectomy. I met with the patient and we discussed the procedure of seed localization including benefits and alternatives. We discussed the high likelihood of a successful procedure. We discussed the risks of the procedure including infection, bleeding, tissue injury and further surgery. We discussed the low dose of radioactivity involved in the procedure. Informed, written consent was given. The usual time-out protocol was performed immediately prior to the procedure. Mammogram images prior to seed placement demonstrated the clip and remaining calcifications to be separated by a distance of at least 2.3 cm. Bracketing  of the clip and remaining calcifications with 2 radioactive seeds was then performed. Using mammographic guidance, sterile technique, 1% lidocaine and 2 separate I-125 radioactive seeds, the biopsy clip and residual calcifications were localized/bracketed using a superior approach. The follow-up mammogram images confirm the seeds in the expected location and were marked for Dr. Marlou Starks. The 2 seeds are separated by a distance of 2.5 cm on the CC view. Follow-up survey of the patient confirms presence of the radioactive seeds. Order number of I-125 seed:  [id number] both seeds. Total activity: 2.585 millicurie both seeds Reference Date: 06/09/2017 both seeds The patient tolerated the procedure well and was released from the [place]. She was given instructions regarding seed removal. IMPRESSION: Two radioactive seed localization/bracketing right breast. No apparent complications. Electronically Signed   By: Margarette Canada M.D.   On: 07/01/2017 15:57   Mm Rt Radio Seed Ea Add Lesion Loc Mammo  Result Date: 07/01/2017 CLINICAL DATA:  71 year old female for radioactive seed bracketing of right breast DCIS prior to lumpectomy. EXAM: MAMMOGRAPHIC GUIDED RADIOACTIVE SEED LOCALIZATION OF THE RIGHT BREAST X 2 COMPARISON:  Previous exam(s). FINDINGS: Patient presents for radioactive seed localization/bracketing prior to right lumpectomy. I met with the patient and we discussed the procedure of seed localization including benefits and alternatives. We discussed the high likelihood of a successful procedure. We discussed the risks of the procedure including infection, bleeding, tissue injury and further surgery. We discussed the low dose of radioactivity involved in the procedure. Informed, written consent was given. The usual time-out protocol was performed immediately prior to the procedure. Mammogram images prior to seed placement demonstrated the clip and remaining calcifications to be separated by a distance of at least  2.3 cm. Bracketing of the clip and remaining calcifications with 2 radioactive seeds was then performed. Using mammographic guidance, sterile technique, 1% lidocaine and 2 separate I-125 radioactive seeds, the biopsy clip and residual calcifications were localized/bracketed using a superior approach. The follow-up mammogram images confirm the seeds in the expected location and were marked for Dr. Marlou Starks. The 2 seeds are separated by a distance of 2.5 cm on the CC view. Follow-up survey of the patient confirms presence of the radioactive seeds. Order number of I-125 seed:  [id number] both seeds. Total activity: 3.614 millicurie both seeds Reference Date: 06/09/2017 both seeds The patient tolerated the procedure well and was released from the [place]. She was given instructions regarding seed removal. IMPRESSION: Two radioactive  seed localization/bracketing right breast. No apparent complications. Electronically Signed   By: Margarette Canada M.D.   On: 07/01/2017 15:57       IMPRESSION/PLAN: 1. 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 Dr. Lisbeth Renshaw reviews the findings from her final pathology. She is not interested in re-excision surgery, and we discussed the scenario with her close margins. She is encouraged to proceed with radiotherapy to treat potentially microscopic disease which would decrease her chance of local failure. Given her close margins we will plan to proceed with repeat Mammogram at the end of December 2018, and if negative she will proceed with radiotherapy. If positive, she would consider additional resection. We discussed the risks, benefits, short, and long term effects of radiotherapy, and the patient is interested in proceeding. Dr. Lisbeth Renshaw discusses the delivery and logistics of radiotherapy and anticipates a course of 4 weeks. Written consent is obtained and placed in the chart, a copy was provided to the patient. She will return for simulation in a few weeks provided the  2. Possible  genetic predisposition to malignancy. The patient is not interested in meeting with genetics at this time, but will let us know if she changes her mind.  In a visit lasting 25 minutes, greater than 50% of the time was spent face to face discussing her pathology, and coordinating the patient's care.  The above documentation reflects my direct findings during this shared patient visit. Please see the separate note by Dr. Lisbeth Renshaw on this date for the remainder of the patient's plan of care.    Carola Rhine, PAC

## 2017-07-28 NOTE — Progress Notes (Signed)
Please see the Nurse Progress Note in the MD Initial Consult Encounter for this patient. 

## 2017-08-04 ENCOUNTER — Telehealth: Payer: Self-pay | Admitting: *Deleted

## 2017-08-04 NOTE — Telephone Encounter (Signed)
Called and spoke with spouse, hs hasn't heard when mammogram is to be scheduled, will call him after mammogram is scheduled, order is in, [employer] to schedule and call spouse ,Legrand Como thanks Rn for getting back in touch with him 12:08 PM

## 2017-08-08 ENCOUNTER — Telehealth: Payer: Self-pay | Admitting: *Deleted

## 2017-08-08 NOTE — Telephone Encounter (Signed)
CALLED PATIENT TO INFORM OF APPT. FOR MAMMOGRAM ON 08-12-17- ARRIVAL TIME- 8:10 AM @ [facility]. TO BE DONE @ 8:30 AM, SPOKE WITH PATIENT AND SHE IS AWARE OF THIS TEST

## 2017-08-12 ENCOUNTER — Ambulatory Visit
Admission: RE | Admit: 2017-08-12 | Discharge: 2017-08-12 | Disposition: A | Discharge status: Home / Self Care | Payer: Medicare HMO | Source: Ambulatory Visit | Attending: Radiation Oncology | Admitting: Radiation Oncology

## 2017-08-12 DIAGNOSIS — Z171 Estrogen receptor negative status [ER-]: Principal | ICD-10-CM

## 2017-08-12 DIAGNOSIS — C50211 Malignant neoplasm of upper-inner quadrant of right female breast: Secondary | ICD-10-CM

## 2017-08-12 DIAGNOSIS — R922 Inconclusive mammogram: Secondary | ICD-10-CM | POA: Diagnosis not present

## 2017-08-15 ENCOUNTER — Telehealth: Payer: Self-pay | Admitting: Radiation Oncology

## 2017-08-15 NOTE — Telephone Encounter (Signed)
I called the patient to let her know that Dr. Lisbeth Renshaw reviewed her mammogram and that we can now move forward with radiation. Sim is scheduled for 08/26/17.

## 2017-08-26 ENCOUNTER — Ambulatory Visit
Admission: RE | Admit: 2017-08-26 | Discharge: 2017-08-26 | Disposition: A | Discharge status: Home / Self Care | Payer: Medicare HMO | Source: Ambulatory Visit | Attending: Radiation Oncology | Admitting: Radiation Oncology

## 2017-08-26 DIAGNOSIS — Z803 Family history of malignant neoplasm of breast: Secondary | ICD-10-CM | POA: Diagnosis not present

## 2017-08-26 DIAGNOSIS — Z90710 Acquired absence of both cervix and uterus: Secondary | ICD-10-CM | POA: Diagnosis not present

## 2017-08-26 DIAGNOSIS — Z171 Estrogen receptor negative status [ER-]: Secondary | ICD-10-CM | POA: Diagnosis not present

## 2017-08-26 DIAGNOSIS — D0511 Intraductal carcinoma in situ of right breast: Secondary | ICD-10-CM

## 2017-08-26 DIAGNOSIS — Z8249 Family history of ischemic heart disease and other diseases of the circulatory system: Secondary | ICD-10-CM | POA: Diagnosis not present

## 2017-08-26 DIAGNOSIS — Z79899 Other long term (current) drug therapy: Secondary | ICD-10-CM | POA: Diagnosis not present

## 2017-08-26 DIAGNOSIS — E7800 Pure hypercholesterolemia, unspecified: Secondary | ICD-10-CM | POA: Diagnosis not present

## 2017-08-26 DIAGNOSIS — Z9889 Other specified postprocedural states: Secondary | ICD-10-CM | POA: Diagnosis not present

## 2017-08-26 DIAGNOSIS — C50211 Malignant neoplasm of upper-inner quadrant of right female breast: Secondary | ICD-10-CM | POA: Diagnosis not present

## 2017-08-26 DIAGNOSIS — Z510 Encounter for antineoplastic radiation therapy: Secondary | ICD-10-CM | POA: Diagnosis not present

## 2017-08-26 NOTE — Progress Notes (Signed)
  Radiation Oncology         [phone] ________________________________  Name: Caroline Wilson MRN: [id number]  Date: 08/26/2017  DOB: 1946-08-20   DIAGNOSIS:     ICD-10-CM   1. Ductal carcinoma in situ (DCIS) of right breast D05.11     SIMULATION AND TREATMENT PLANNING NOTE  The patient presented for simulation prior to beginning her course of radiation treatment for her diagnosis of right-sided breast cancer. The patient was placed in a supine position on a breast board. A customized vac-lock bag was constructed and this complex treatment device will be used on a daily basis during her treatment. In this fashion, a CT scan was obtained through the chest area and an isocenter was placed near the chest wall within the breast.  The patient will be planned to receive a course of radiation initially to a dose of 42.56 Gy. This will consist of a whole breast radiotherapy technique. To accomplish this, 2 customized blocks have been designed which will correspond to medial and lateral whole breast tangent fields. This treatment will be accomplished at 2.66 Gy per fraction. A forward planning technique will also be evaluated to determine if this approach improves the plan. It is anticipated that the patient will then receive a 10 Gy boost to the seroma cavity which has been contoured. This will be accomplished at 2.5 Gy per fraction.   This initial treatment will consist of a 3-D conformal technique. The seroma has been contoured as the primary target structure. Additionally, dose volume histograms of both this target as well as the lungs and heart will also be evaluated. Such an approach is necessary to ensure that the target area is adequately covered while the nearby critical  normal structures are adequately spared.  Plan:  The final anticipated total dose therefore will correspond to 50 Gy.    _______________________________   Jodelle Gross, MD, PhD

## 2017-08-26 NOTE — Progress Notes (Signed)
  Radiation Oncology         [phone] ________________________________  Name: Caroline Wilson MRN: [id number]  Date: 08/26/2017  DOB: 23-Jul-1946  Optical Surface Tracking Plan:  Since intensity modulated radiotherapy (IMRT) and 3D conformal radiation treatment methods are predicated on accurate and precise positioning for treatment, intrafraction motion monitoring is medically necessary to ensure accurate and safe treatment delivery.  The ability to quantify intrafraction motion without excessive ionizing radiation dose can only be performed with optical surface tracking. Accordingly, surface imaging offers the opportunity to obtain 3D measurements of patient position throughout IMRT and 3D treatments without excessive radiation exposure.  I am ordering optical surface tracking for this patient's upcoming course of radiotherapy. ________________________________  Kyung Rudd, MD 08/26/2017 4:52 PM    Reference:   Ursula Alert, J, et al. Surface imaging-based analysis of intrafraction motion for breast radiotherapy patients.Journal of [place], n. 6, nov. 2014. ISSN [id number].   Available at: <http://www.jacmp.org/index.php/jacmp/article/view/4957>.

## 2017-08-27 DIAGNOSIS — Z79899 Other long term (current) drug therapy: Secondary | ICD-10-CM | POA: Diagnosis not present

## 2017-08-27 DIAGNOSIS — Z510 Encounter for antineoplastic radiation therapy: Secondary | ICD-10-CM | POA: Diagnosis not present

## 2017-08-27 DIAGNOSIS — E7800 Pure hypercholesterolemia, unspecified: Secondary | ICD-10-CM | POA: Diagnosis not present

## 2017-08-27 DIAGNOSIS — D0511 Intraductal carcinoma in situ of right breast: Secondary | ICD-10-CM | POA: Diagnosis not present

## 2017-08-27 DIAGNOSIS — Z8249 Family history of ischemic heart disease and other diseases of the circulatory system: Secondary | ICD-10-CM | POA: Diagnosis not present

## 2017-08-27 DIAGNOSIS — Z9889 Other specified postprocedural states: Secondary | ICD-10-CM | POA: Diagnosis not present

## 2017-08-27 DIAGNOSIS — Z90710 Acquired absence of both cervix and uterus: Secondary | ICD-10-CM | POA: Diagnosis not present

## 2017-08-27 DIAGNOSIS — C50211 Malignant neoplasm of upper-inner quadrant of right female breast: Secondary | ICD-10-CM | POA: Diagnosis not present

## 2017-08-27 DIAGNOSIS — Z803 Family history of malignant neoplasm of breast: Secondary | ICD-10-CM | POA: Diagnosis not present

## 2017-08-27 DIAGNOSIS — Z171 Estrogen receptor negative status [ER-]: Secondary | ICD-10-CM | POA: Diagnosis not present

## 2017-09-05 ENCOUNTER — Ambulatory Visit: Payer: Medicare HMO | Admitting: Radiation Oncology

## 2017-09-06 ENCOUNTER — Ambulatory Visit
Admission: RE | Admit: 2017-09-06 | Discharge: 2017-09-06 | Disposition: A | Discharge status: Home / Self Care | Payer: Medicare HMO | Source: Ambulatory Visit | Attending: Radiation Oncology | Admitting: Radiation Oncology

## 2017-09-06 DIAGNOSIS — Z9889 Other specified postprocedural states: Secondary | ICD-10-CM | POA: Diagnosis not present

## 2017-09-06 DIAGNOSIS — Z90710 Acquired absence of both cervix and uterus: Secondary | ICD-10-CM | POA: Diagnosis not present

## 2017-09-06 DIAGNOSIS — C50211 Malignant neoplasm of upper-inner quadrant of right female breast: Secondary | ICD-10-CM | POA: Diagnosis not present

## 2017-09-06 DIAGNOSIS — Z510 Encounter for antineoplastic radiation therapy: Secondary | ICD-10-CM | POA: Diagnosis not present

## 2017-09-06 DIAGNOSIS — D0511 Intraductal carcinoma in situ of right breast: Secondary | ICD-10-CM | POA: Diagnosis not present

## 2017-09-06 DIAGNOSIS — Z803 Family history of malignant neoplasm of breast: Secondary | ICD-10-CM | POA: Diagnosis not present

## 2017-09-06 DIAGNOSIS — Z171 Estrogen receptor negative status [ER-]: Secondary | ICD-10-CM | POA: Diagnosis not present

## 2017-09-06 DIAGNOSIS — E7800 Pure hypercholesterolemia, unspecified: Secondary | ICD-10-CM | POA: Diagnosis not present

## 2017-09-06 DIAGNOSIS — Z79899 Other long term (current) drug therapy: Secondary | ICD-10-CM | POA: Diagnosis not present

## 2017-09-07 ENCOUNTER — Ambulatory Visit
Admission: RE | Admit: 2017-09-07 | Discharge: 2017-09-07 | Disposition: A | Discharge status: Home / Self Care | Payer: Medicare HMO | Source: Ambulatory Visit | Attending: Radiation Oncology | Admitting: Radiation Oncology

## 2017-09-07 DIAGNOSIS — C50211 Malignant neoplasm of upper-inner quadrant of right female breast: Secondary | ICD-10-CM | POA: Diagnosis not present

## 2017-09-07 DIAGNOSIS — D0511 Intraductal carcinoma in situ of right breast: Secondary | ICD-10-CM | POA: Diagnosis not present

## 2017-09-07 DIAGNOSIS — Z9889 Other specified postprocedural states: Secondary | ICD-10-CM | POA: Diagnosis not present

## 2017-09-07 DIAGNOSIS — Z90710 Acquired absence of both cervix and uterus: Secondary | ICD-10-CM | POA: Diagnosis not present

## 2017-09-07 DIAGNOSIS — Z510 Encounter for antineoplastic radiation therapy: Secondary | ICD-10-CM | POA: Diagnosis not present

## 2017-09-07 DIAGNOSIS — E7800 Pure hypercholesterolemia, unspecified: Secondary | ICD-10-CM | POA: Diagnosis not present

## 2017-09-07 DIAGNOSIS — Z79899 Other long term (current) drug therapy: Secondary | ICD-10-CM | POA: Diagnosis not present

## 2017-09-07 DIAGNOSIS — Z803 Family history of malignant neoplasm of breast: Secondary | ICD-10-CM | POA: Diagnosis not present

## 2017-09-07 DIAGNOSIS — Z171 Estrogen receptor negative status [ER-]: Secondary | ICD-10-CM | POA: Diagnosis not present

## 2017-09-08 ENCOUNTER — Ambulatory Visit
Admission: RE | Admit: 2017-09-08 | Discharge: 2017-09-08 | Disposition: A | Discharge status: Home / Self Care | Payer: Medicare HMO | Source: Ambulatory Visit | Attending: Radiation Oncology | Admitting: Radiation Oncology

## 2017-09-08 DIAGNOSIS — C50211 Malignant neoplasm of upper-inner quadrant of right female breast: Secondary | ICD-10-CM | POA: Diagnosis not present

## 2017-09-08 DIAGNOSIS — E7800 Pure hypercholesterolemia, unspecified: Secondary | ICD-10-CM | POA: Diagnosis not present

## 2017-09-08 DIAGNOSIS — D0511 Intraductal carcinoma in situ of right breast: Secondary | ICD-10-CM | POA: Diagnosis not present

## 2017-09-08 DIAGNOSIS — Z803 Family history of malignant neoplasm of breast: Secondary | ICD-10-CM | POA: Diagnosis not present

## 2017-09-08 DIAGNOSIS — Z90710 Acquired absence of both cervix and uterus: Secondary | ICD-10-CM | POA: Diagnosis not present

## 2017-09-08 DIAGNOSIS — Z79899 Other long term (current) drug therapy: Secondary | ICD-10-CM | POA: Diagnosis not present

## 2017-09-08 DIAGNOSIS — Z9889 Other specified postprocedural states: Secondary | ICD-10-CM | POA: Diagnosis not present

## 2017-09-08 DIAGNOSIS — Z171 Estrogen receptor negative status [ER-]: Secondary | ICD-10-CM | POA: Diagnosis not present

## 2017-09-08 DIAGNOSIS — Z510 Encounter for antineoplastic radiation therapy: Secondary | ICD-10-CM | POA: Diagnosis not present

## 2017-09-09 ENCOUNTER — Ambulatory Visit
Admission: RE | Admit: 2017-09-09 | Discharge: 2017-09-09 | Disposition: A | Discharge status: Home / Self Care | Payer: Medicare HMO | Source: Ambulatory Visit | Attending: Radiation Oncology | Admitting: Radiation Oncology

## 2017-09-09 DIAGNOSIS — E7800 Pure hypercholesterolemia, unspecified: Secondary | ICD-10-CM | POA: Diagnosis not present

## 2017-09-09 DIAGNOSIS — D0511 Intraductal carcinoma in situ of right breast: Secondary | ICD-10-CM

## 2017-09-09 DIAGNOSIS — Z510 Encounter for antineoplastic radiation therapy: Secondary | ICD-10-CM | POA: Diagnosis not present

## 2017-09-09 DIAGNOSIS — Z803 Family history of malignant neoplasm of breast: Secondary | ICD-10-CM | POA: Diagnosis not present

## 2017-09-09 DIAGNOSIS — C50211 Malignant neoplasm of upper-inner quadrant of right female breast: Secondary | ICD-10-CM | POA: Diagnosis not present

## 2017-09-09 DIAGNOSIS — Z171 Estrogen receptor negative status [ER-]: Secondary | ICD-10-CM | POA: Diagnosis not present

## 2017-09-09 DIAGNOSIS — Z90710 Acquired absence of both cervix and uterus: Secondary | ICD-10-CM | POA: Diagnosis not present

## 2017-09-09 DIAGNOSIS — Z79899 Other long term (current) drug therapy: Secondary | ICD-10-CM | POA: Diagnosis not present

## 2017-09-09 DIAGNOSIS — Z9889 Other specified postprocedural states: Secondary | ICD-10-CM | POA: Diagnosis not present

## 2017-09-09 MED ORDER — RADIAPLEXRX EX GEL
Freq: Once | CUTANEOUS | Status: AC
  Administered 2017-09-09: 12:00:00 via TOPICAL

## 2017-09-09 MED ORDER — ALRA NON-METALLIC DEODORANT (RAD-ONC)
1.0000 "application " | Freq: Once | TOPICAL | Status: AC
  Administered 2017-09-09: 1 via TOPICAL

## 2017-09-12 ENCOUNTER — Ambulatory Visit
Admission: RE | Admit: 2017-09-12 | Discharge: 2017-09-12 | Disposition: A | Discharge status: Home / Self Care | Payer: Medicare HMO | Source: Ambulatory Visit | Attending: Radiation Oncology | Admitting: Radiation Oncology

## 2017-09-12 DIAGNOSIS — Z171 Estrogen receptor negative status [ER-]: Secondary | ICD-10-CM | POA: Diagnosis not present

## 2017-09-12 DIAGNOSIS — Z90710 Acquired absence of both cervix and uterus: Secondary | ICD-10-CM | POA: Diagnosis not present

## 2017-09-12 DIAGNOSIS — Z9889 Other specified postprocedural states: Secondary | ICD-10-CM | POA: Diagnosis not present

## 2017-09-12 DIAGNOSIS — C50211 Malignant neoplasm of upper-inner quadrant of right female breast: Secondary | ICD-10-CM | POA: Diagnosis not present

## 2017-09-12 DIAGNOSIS — E7800 Pure hypercholesterolemia, unspecified: Secondary | ICD-10-CM | POA: Diagnosis not present

## 2017-09-12 DIAGNOSIS — Z803 Family history of malignant neoplasm of breast: Secondary | ICD-10-CM | POA: Diagnosis not present

## 2017-09-12 DIAGNOSIS — D0511 Intraductal carcinoma in situ of right breast: Secondary | ICD-10-CM | POA: Diagnosis not present

## 2017-09-12 DIAGNOSIS — Z79899 Other long term (current) drug therapy: Secondary | ICD-10-CM | POA: Diagnosis not present

## 2017-09-12 DIAGNOSIS — Z510 Encounter for antineoplastic radiation therapy: Secondary | ICD-10-CM | POA: Diagnosis not present

## 2017-09-13 ENCOUNTER — Ambulatory Visit
Admission: RE | Admit: 2017-09-13 | Discharge: 2017-09-13 | Disposition: A | Discharge status: Home / Self Care | Payer: Medicare HMO | Source: Ambulatory Visit | Attending: Radiation Oncology | Admitting: Radiation Oncology

## 2017-09-13 DIAGNOSIS — Z8249 Family history of ischemic heart disease and other diseases of the circulatory system: Secondary | ICD-10-CM | POA: Diagnosis not present

## 2017-09-13 DIAGNOSIS — Z9889 Other specified postprocedural states: Secondary | ICD-10-CM | POA: Diagnosis not present

## 2017-09-13 DIAGNOSIS — D0511 Intraductal carcinoma in situ of right breast: Secondary | ICD-10-CM | POA: Diagnosis not present

## 2017-09-13 DIAGNOSIS — E7800 Pure hypercholesterolemia, unspecified: Secondary | ICD-10-CM | POA: Diagnosis not present

## 2017-09-13 DIAGNOSIS — Z510 Encounter for antineoplastic radiation therapy: Secondary | ICD-10-CM | POA: Diagnosis not present

## 2017-09-13 DIAGNOSIS — Z171 Estrogen receptor negative status [ER-]: Secondary | ICD-10-CM | POA: Diagnosis not present

## 2017-09-13 DIAGNOSIS — C50211 Malignant neoplasm of upper-inner quadrant of right female breast: Secondary | ICD-10-CM | POA: Diagnosis not present

## 2017-09-13 DIAGNOSIS — Z90710 Acquired absence of both cervix and uterus: Secondary | ICD-10-CM | POA: Diagnosis not present

## 2017-09-13 DIAGNOSIS — Z803 Family history of malignant neoplasm of breast: Secondary | ICD-10-CM | POA: Diagnosis not present

## 2017-09-13 DIAGNOSIS — Z79899 Other long term (current) drug therapy: Secondary | ICD-10-CM | POA: Diagnosis not present

## 2017-09-14 ENCOUNTER — Ambulatory Visit
Admission: RE | Admit: 2017-09-14 | Discharge: 2017-09-14 | Disposition: A | Discharge status: Home / Self Care | Payer: Medicare HMO | Source: Ambulatory Visit | Attending: Radiation Oncology | Admitting: Radiation Oncology

## 2017-09-14 DIAGNOSIS — D0511 Intraductal carcinoma in situ of right breast: Secondary | ICD-10-CM | POA: Diagnosis not present

## 2017-09-14 DIAGNOSIS — Z803 Family history of malignant neoplasm of breast: Secondary | ICD-10-CM | POA: Diagnosis not present

## 2017-09-14 DIAGNOSIS — C50211 Malignant neoplasm of upper-inner quadrant of right female breast: Secondary | ICD-10-CM | POA: Diagnosis not present

## 2017-09-14 DIAGNOSIS — Z510 Encounter for antineoplastic radiation therapy: Secondary | ICD-10-CM | POA: Diagnosis not present

## 2017-09-14 DIAGNOSIS — Z90710 Acquired absence of both cervix and uterus: Secondary | ICD-10-CM | POA: Diagnosis not present

## 2017-09-14 DIAGNOSIS — Z9889 Other specified postprocedural states: Secondary | ICD-10-CM | POA: Diagnosis not present

## 2017-09-14 DIAGNOSIS — E7800 Pure hypercholesterolemia, unspecified: Secondary | ICD-10-CM | POA: Diagnosis not present

## 2017-09-14 DIAGNOSIS — Z79899 Other long term (current) drug therapy: Secondary | ICD-10-CM | POA: Diagnosis not present

## 2017-09-14 DIAGNOSIS — Z171 Estrogen receptor negative status [ER-]: Secondary | ICD-10-CM | POA: Diagnosis not present

## 2017-09-14 DIAGNOSIS — Z8249 Family history of ischemic heart disease and other diseases of the circulatory system: Secondary | ICD-10-CM | POA: Diagnosis not present

## 2017-09-15 ENCOUNTER — Ambulatory Visit
Admission: RE | Admit: 2017-09-15 | Discharge: 2017-09-15 | Disposition: A | Discharge status: Home / Self Care | Payer: Medicare HMO | Source: Ambulatory Visit | Attending: Radiation Oncology | Admitting: Radiation Oncology

## 2017-09-15 DIAGNOSIS — D0511 Intraductal carcinoma in situ of right breast: Secondary | ICD-10-CM | POA: Diagnosis not present

## 2017-09-15 DIAGNOSIS — Z171 Estrogen receptor negative status [ER-]: Secondary | ICD-10-CM | POA: Diagnosis not present

## 2017-09-15 DIAGNOSIS — C50211 Malignant neoplasm of upper-inner quadrant of right female breast: Secondary | ICD-10-CM | POA: Diagnosis not present

## 2017-09-15 DIAGNOSIS — Z510 Encounter for antineoplastic radiation therapy: Secondary | ICD-10-CM | POA: Diagnosis not present

## 2017-09-15 DIAGNOSIS — Z90710 Acquired absence of both cervix and uterus: Secondary | ICD-10-CM | POA: Diagnosis not present

## 2017-09-15 DIAGNOSIS — Z9889 Other specified postprocedural states: Secondary | ICD-10-CM | POA: Diagnosis not present

## 2017-09-15 DIAGNOSIS — Z79899 Other long term (current) drug therapy: Secondary | ICD-10-CM | POA: Diagnosis not present

## 2017-09-15 DIAGNOSIS — E7800 Pure hypercholesterolemia, unspecified: Secondary | ICD-10-CM | POA: Diagnosis not present

## 2017-09-15 DIAGNOSIS — Z803 Family history of malignant neoplasm of breast: Secondary | ICD-10-CM | POA: Diagnosis not present

## 2017-09-16 ENCOUNTER — Ambulatory Visit
Admission: RE | Admit: 2017-09-16 | Discharge: 2017-09-16 | Disposition: A | Discharge status: Home / Self Care | Payer: Medicare HMO | Source: Ambulatory Visit | Attending: Radiation Oncology | Admitting: Radiation Oncology

## 2017-09-16 DIAGNOSIS — Z171 Estrogen receptor negative status [ER-]: Secondary | ICD-10-CM | POA: Diagnosis not present

## 2017-09-16 DIAGNOSIS — Z90710 Acquired absence of both cervix and uterus: Secondary | ICD-10-CM | POA: Diagnosis not present

## 2017-09-16 DIAGNOSIS — Z803 Family history of malignant neoplasm of breast: Secondary | ICD-10-CM | POA: Diagnosis not present

## 2017-09-16 DIAGNOSIS — Z79899 Other long term (current) drug therapy: Secondary | ICD-10-CM | POA: Diagnosis not present

## 2017-09-16 DIAGNOSIS — E7800 Pure hypercholesterolemia, unspecified: Secondary | ICD-10-CM | POA: Diagnosis not present

## 2017-09-16 DIAGNOSIS — C50211 Malignant neoplasm of upper-inner quadrant of right female breast: Secondary | ICD-10-CM | POA: Diagnosis not present

## 2017-09-16 DIAGNOSIS — Z9889 Other specified postprocedural states: Secondary | ICD-10-CM | POA: Diagnosis not present

## 2017-09-16 DIAGNOSIS — D0511 Intraductal carcinoma in situ of right breast: Secondary | ICD-10-CM | POA: Diagnosis not present

## 2017-09-16 DIAGNOSIS — Z510 Encounter for antineoplastic radiation therapy: Secondary | ICD-10-CM | POA: Diagnosis not present

## 2017-09-19 ENCOUNTER — Ambulatory Visit
Admission: RE | Admit: 2017-09-19 | Discharge: 2017-09-19 | Disposition: A | Discharge status: Home / Self Care | Payer: Medicare HMO | Source: Ambulatory Visit | Attending: Radiation Oncology | Admitting: Radiation Oncology

## 2017-09-19 DIAGNOSIS — Z79899 Other long term (current) drug therapy: Secondary | ICD-10-CM | POA: Diagnosis not present

## 2017-09-19 DIAGNOSIS — Z803 Family history of malignant neoplasm of breast: Secondary | ICD-10-CM | POA: Diagnosis not present

## 2017-09-19 DIAGNOSIS — E7800 Pure hypercholesterolemia, unspecified: Secondary | ICD-10-CM | POA: Diagnosis not present

## 2017-09-19 DIAGNOSIS — D0511 Intraductal carcinoma in situ of right breast: Secondary | ICD-10-CM | POA: Diagnosis not present

## 2017-09-19 DIAGNOSIS — Z90710 Acquired absence of both cervix and uterus: Secondary | ICD-10-CM | POA: Diagnosis not present

## 2017-09-19 DIAGNOSIS — Z9889 Other specified postprocedural states: Secondary | ICD-10-CM | POA: Diagnosis not present

## 2017-09-19 DIAGNOSIS — Z510 Encounter for antineoplastic radiation therapy: Secondary | ICD-10-CM | POA: Diagnosis not present

## 2017-09-19 DIAGNOSIS — Z171 Estrogen receptor negative status [ER-]: Secondary | ICD-10-CM | POA: Diagnosis not present

## 2017-09-19 DIAGNOSIS — C50211 Malignant neoplasm of upper-inner quadrant of right female breast: Secondary | ICD-10-CM | POA: Diagnosis not present

## 2017-09-20 ENCOUNTER — Ambulatory Visit
Admission: RE | Admit: 2017-09-20 | Discharge: 2017-09-20 | Disposition: A | Discharge status: Home / Self Care | Payer: Medicare HMO | Source: Ambulatory Visit | Attending: Radiation Oncology | Admitting: Radiation Oncology

## 2017-09-20 DIAGNOSIS — Z510 Encounter for antineoplastic radiation therapy: Secondary | ICD-10-CM | POA: Insufficient documentation

## 2017-09-20 DIAGNOSIS — D0511 Intraductal carcinoma in situ of right breast: Secondary | ICD-10-CM | POA: Diagnosis not present

## 2017-09-20 DIAGNOSIS — Z171 Estrogen receptor negative status [ER-]: Secondary | ICD-10-CM | POA: Insufficient documentation

## 2017-09-21 ENCOUNTER — Ambulatory Visit
Admission: RE | Admit: 2017-09-21 | Discharge: 2017-09-21 | Disposition: A | Discharge status: Home / Self Care | Payer: Medicare HMO | Source: Ambulatory Visit | Attending: Radiation Oncology | Admitting: Radiation Oncology

## 2017-09-21 DIAGNOSIS — Z171 Estrogen receptor negative status [ER-]: Secondary | ICD-10-CM | POA: Diagnosis not present

## 2017-09-21 DIAGNOSIS — Z510 Encounter for antineoplastic radiation therapy: Secondary | ICD-10-CM | POA: Diagnosis not present

## 2017-09-21 DIAGNOSIS — D0511 Intraductal carcinoma in situ of right breast: Secondary | ICD-10-CM | POA: Diagnosis not present

## 2017-09-22 ENCOUNTER — Ambulatory Visit
Admission: RE | Admit: 2017-09-22 | Discharge: 2017-09-22 | Disposition: A | Discharge status: Home / Self Care | Payer: Medicare HMO | Source: Ambulatory Visit | Attending: Radiation Oncology | Admitting: Radiation Oncology

## 2017-09-22 DIAGNOSIS — D0511 Intraductal carcinoma in situ of right breast: Secondary | ICD-10-CM | POA: Diagnosis not present

## 2017-09-22 DIAGNOSIS — Z171 Estrogen receptor negative status [ER-]: Secondary | ICD-10-CM | POA: Diagnosis not present

## 2017-09-22 DIAGNOSIS — Z510 Encounter for antineoplastic radiation therapy: Secondary | ICD-10-CM | POA: Diagnosis not present

## 2017-09-23 ENCOUNTER — Ambulatory Visit
Admission: RE | Admit: 2017-09-23 | Discharge: 2017-09-23 | Disposition: A | Discharge status: Home / Self Care | Payer: Medicare HMO | Source: Ambulatory Visit | Attending: Radiation Oncology | Admitting: Radiation Oncology

## 2017-09-23 DIAGNOSIS — D0511 Intraductal carcinoma in situ of right breast: Secondary | ICD-10-CM | POA: Diagnosis not present

## 2017-09-23 DIAGNOSIS — Z510 Encounter for antineoplastic radiation therapy: Secondary | ICD-10-CM | POA: Diagnosis not present

## 2017-09-23 DIAGNOSIS — Z171 Estrogen receptor negative status [ER-]: Secondary | ICD-10-CM | POA: Diagnosis not present

## 2017-09-26 ENCOUNTER — Ambulatory Visit
Admission: RE | Admit: 2017-09-26 | Discharge: 2017-09-26 | Disposition: A | Discharge status: Home / Self Care | Payer: Medicare HMO | Source: Ambulatory Visit | Attending: Radiation Oncology | Admitting: Radiation Oncology

## 2017-09-26 DIAGNOSIS — Z510 Encounter for antineoplastic radiation therapy: Secondary | ICD-10-CM | POA: Diagnosis not present

## 2017-09-26 DIAGNOSIS — D0511 Intraductal carcinoma in situ of right breast: Secondary | ICD-10-CM | POA: Diagnosis not present

## 2017-09-26 DIAGNOSIS — Z171 Estrogen receptor negative status [ER-]: Secondary | ICD-10-CM | POA: Diagnosis not present

## 2017-09-27 ENCOUNTER — Ambulatory Visit
Admission: RE | Admit: 2017-09-27 | Discharge: 2017-09-27 | Disposition: A | Discharge status: Home / Self Care | Payer: Medicare HMO | Source: Ambulatory Visit | Attending: Radiation Oncology | Admitting: Radiation Oncology

## 2017-09-27 DIAGNOSIS — Z510 Encounter for antineoplastic radiation therapy: Secondary | ICD-10-CM | POA: Diagnosis not present

## 2017-09-27 DIAGNOSIS — D0511 Intraductal carcinoma in situ of right breast: Secondary | ICD-10-CM | POA: Diagnosis not present

## 2017-09-27 DIAGNOSIS — Z171 Estrogen receptor negative status [ER-]: Secondary | ICD-10-CM | POA: Diagnosis not present

## 2017-09-28 ENCOUNTER — Ambulatory Visit
Admission: RE | Admit: 2017-09-28 | Discharge: 2017-09-28 | Disposition: A | Discharge status: Home / Self Care | Payer: Medicare HMO | Source: Ambulatory Visit | Attending: Radiation Oncology | Admitting: Radiation Oncology

## 2017-09-28 DIAGNOSIS — Z510 Encounter for antineoplastic radiation therapy: Secondary | ICD-10-CM | POA: Diagnosis not present

## 2017-09-28 DIAGNOSIS — Z171 Estrogen receptor negative status [ER-]: Secondary | ICD-10-CM | POA: Diagnosis not present

## 2017-09-28 DIAGNOSIS — D0511 Intraductal carcinoma in situ of right breast: Secondary | ICD-10-CM | POA: Diagnosis not present

## 2017-09-29 ENCOUNTER — Ambulatory Visit
Admission: RE | Admit: 2017-09-29 | Discharge: 2017-09-29 | Disposition: A | Discharge status: Home / Self Care | Payer: Medicare HMO | Source: Ambulatory Visit | Attending: Radiation Oncology | Admitting: Radiation Oncology

## 2017-09-29 DIAGNOSIS — Z171 Estrogen receptor negative status [ER-]: Secondary | ICD-10-CM | POA: Diagnosis not present

## 2017-09-29 DIAGNOSIS — Z510 Encounter for antineoplastic radiation therapy: Secondary | ICD-10-CM | POA: Diagnosis not present

## 2017-09-29 DIAGNOSIS — D0511 Intraductal carcinoma in situ of right breast: Secondary | ICD-10-CM | POA: Diagnosis not present

## 2017-09-30 ENCOUNTER — Ambulatory Visit
Admission: RE | Admit: 2017-09-30 | Discharge: 2017-09-30 | Disposition: A | Discharge status: Home / Self Care | Payer: Medicare HMO | Source: Ambulatory Visit | Attending: Radiation Oncology | Admitting: Radiation Oncology

## 2017-09-30 DIAGNOSIS — Z171 Estrogen receptor negative status [ER-]: Secondary | ICD-10-CM | POA: Diagnosis not present

## 2017-09-30 DIAGNOSIS — D0511 Intraductal carcinoma in situ of right breast: Secondary | ICD-10-CM | POA: Diagnosis not present

## 2017-09-30 DIAGNOSIS — Z510 Encounter for antineoplastic radiation therapy: Secondary | ICD-10-CM | POA: Diagnosis not present

## 2017-10-03 ENCOUNTER — Ambulatory Visit
Admission: RE | Admit: 2017-10-03 | Discharge: 2017-10-03 | Disposition: A | Discharge status: Home / Self Care | Payer: Medicare HMO | Source: Ambulatory Visit | Attending: Radiation Oncology | Admitting: Radiation Oncology

## 2017-10-03 DIAGNOSIS — D0511 Intraductal carcinoma in situ of right breast: Secondary | ICD-10-CM | POA: Diagnosis not present

## 2017-10-03 DIAGNOSIS — Z510 Encounter for antineoplastic radiation therapy: Secondary | ICD-10-CM | POA: Diagnosis not present

## 2017-10-03 DIAGNOSIS — Z171 Estrogen receptor negative status [ER-]: Secondary | ICD-10-CM | POA: Diagnosis not present

## 2017-10-04 ENCOUNTER — Ambulatory Visit
Admission: RE | Admit: 2017-10-04 | Discharge: 2017-10-04 | Disposition: A | Discharge status: Home / Self Care | Payer: Medicare HMO | Source: Ambulatory Visit | Attending: Radiation Oncology | Admitting: Radiation Oncology

## 2017-10-04 DIAGNOSIS — Z510 Encounter for antineoplastic radiation therapy: Secondary | ICD-10-CM | POA: Diagnosis not present

## 2017-10-04 DIAGNOSIS — D0511 Intraductal carcinoma in situ of right breast: Secondary | ICD-10-CM | POA: Diagnosis not present

## 2017-10-04 DIAGNOSIS — Z171 Estrogen receptor negative status [ER-]: Secondary | ICD-10-CM | POA: Diagnosis not present

## 2017-10-11 ENCOUNTER — Encounter: Payer: Self-pay | Admitting: Radiation Oncology

## 2017-10-11 NOTE — Progress Notes (Signed)
  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 The patient complained of sharp shooting pain and itching to the treatment area. She had hyperpigmentation without desquamation to the treatment area.   Plan: The patient has completed radiation treatment. The patient will return to radiation oncology clinic for routine followup in one month. I advised them to call or return sooner if they have any questions or concerns related to their recovery or treatment.  ------------------------------------------------  Jodelle Gross, MD, PhD  This document serves as a record of services personally performed by Kyung Rudd, MD. It was created on his behalf by Bethann Humble, a trained medical scribe. The creation of this record is based on the scribe's personal observations and the provider's statements to them. This document has been checked and approved by the attending provider.

## 2017-11-10 DIAGNOSIS — E7800 Pure hypercholesterolemia, unspecified: Secondary | ICD-10-CM | POA: Diagnosis not present

## 2018-01-14 ENCOUNTER — Emergency Department (HOSPITAL_BASED_OUTPATIENT_CLINIC_OR_DEPARTMENT_OTHER)
Admission: EM | Admit: 2018-01-14 | Discharge: 2018-01-14 | Disposition: A | Discharge status: Home / Self Care | Payer: Medicare HMO | Attending: Emergency Medicine | Admitting: Emergency Medicine

## 2018-01-14 ENCOUNTER — Other Ambulatory Visit: Payer: Self-pay

## 2018-01-14 ENCOUNTER — Encounter (HOSPITAL_BASED_OUTPATIENT_CLINIC_OR_DEPARTMENT_OTHER): Payer: Self-pay | Admitting: *Deleted

## 2018-01-14 DIAGNOSIS — Y939 Activity, unspecified: Secondary | ICD-10-CM | POA: Diagnosis not present

## 2018-01-14 DIAGNOSIS — Z23 Encounter for immunization: Secondary | ICD-10-CM | POA: Diagnosis not present

## 2018-01-14 DIAGNOSIS — W268XXA Contact with other sharp object(s), not elsewhere classified, initial encounter: Secondary | ICD-10-CM | POA: Insufficient documentation

## 2018-01-14 DIAGNOSIS — Z79899 Other long term (current) drug therapy: Secondary | ICD-10-CM | POA: Insufficient documentation

## 2018-01-14 DIAGNOSIS — Y999 Unspecified external cause status: Secondary | ICD-10-CM | POA: Insufficient documentation

## 2018-01-14 DIAGNOSIS — Z7982 Long term (current) use of aspirin: Secondary | ICD-10-CM | POA: Diagnosis not present

## 2018-01-14 DIAGNOSIS — S61210A Laceration without foreign body of right index finger without damage to nail, initial encounter: Secondary | ICD-10-CM | POA: Insufficient documentation

## 2018-01-14 DIAGNOSIS — Z853 Personal history of malignant neoplasm of breast: Secondary | ICD-10-CM | POA: Diagnosis not present

## 2018-01-14 DIAGNOSIS — Y929 Unspecified place or not applicable: Secondary | ICD-10-CM | POA: Diagnosis not present

## 2018-01-14 MED ORDER — LIDOCAINE HCL (PF) 2 % IJ SOLN
INTRAMUSCULAR | Status: AC
  Filled 2018-01-14: qty 4

## 2018-01-14 MED ORDER — LIDOCAINE HCL 2 % IJ SOLN
10.0000 mL | Freq: Once | INTRAMUSCULAR | Status: AC
  Administered 2018-01-14: 200 mg via INTRADERMAL
  Filled 2018-01-14: qty 10

## 2018-01-14 MED ORDER — TETANUS-DIPHTH-ACELL PERTUSSIS 5-2.5-18.5 LF-MCG/0.5 IM SUSP
0.5000 mL | Freq: Once | INTRAMUSCULAR | Status: AC
  Administered 2018-01-14: 0.5 mL via INTRAMUSCULAR
  Filled 2018-01-14: qty 0.5

## 2018-01-14 NOTE — Discharge Instructions (Addendum)
Follow up with your regular doctor in 7-10days to have stitches removed.   Gently wash with warm soapy water daily to prevent infection. Keep covered with a Band-aid.  You can take ibuprofen as needed for pain.  Return to the ER if you notice any signs of infection like redness, swelling, pain or fever greater than 100.4 F.

## 2018-01-14 NOTE — ED Triage Notes (Signed)
approx 1inch laceration to top of right pointer finger from aluminum foil PTA. Wound is oozing blood. Approximated. Pressure dressing applied in triage.

## 2018-01-14 NOTE — ED Provider Notes (Signed)
[facility] EMERGENCY DEPARTMENT Provider Note   CSN: [id number] Arrival date & time: 01/14/18  1533     History   Chief Complaint Chief Complaint  Patient presents with  . Extremity Laceration    HPI Caroline Wilson is a 71 y.o. female.  HPI   Caroline Wilson is a 72 year old female with no significant past medical history who presents to the emergency department for evaluation of right index finger laceration.  She reports she cut the base of the finger on the serrated edge of the tinfoil container.  This happened about 330 this afternoon.  Reports that she washed it with cold water and applied pressure and immediately came to the emergency department.  She states that it has been oozing blood ever since.  States that she has a stinging sensation over the laceration, worse with palpation.  She has not taken any over-the-counter medications prior to arrival.  Denies blood thinner use.  States that her last tetanus vaccination was 8 or 9 years ago.  She denies fevers, chills, numbness, weakness, wound elsewhere.  Past Medical History:  Diagnosis Date  . Arthritis    knees, neck  . Breast cancer ([place]) 06/06/2017   right breast DCIS  . H/O seasonal allergies   . Headache   . Hypercholesteremia   . Knee pain     Patient Active Problem List   Diagnosis Date Noted  . Ductal carcinoma in situ (DCIS) of right breast 06/21/2017  . Atypical chest pain 01/31/2017  . Pain in both lower extremities 01/31/2017  . Hyperlipidemia 01/31/2017  . Abnormal laboratory test 07/07/2016  . New onset headache 07/05/2016    Past Surgical History:  Procedure Laterality Date  . ABDOMINAL HYSTERECTOMY    . BREAST BIOPSY    . BREAST LUMPECTOMY WITH RADIOACTIVE SEED LOCALIZATION Right 07/04/2017   Procedure: BREAST LUMPECTOMY WITH RADIOACTIVE SEED LOCALIZATION;  Surgeon: Jovita Kussmaul, MD;  Location: [facility];  Service: General;  Laterality: Right;  . TUBAL LIGATION        OB History    Gravida  3   Para  3   Term      Preterm      AB  0   Living  3     SAB  0   TAB  0   Ectopic  0   Multiple  0   Live Births  3            Home Medications    Prior to Admission medications   Medication Sig Start Date End Date Taking? Authorizing Provider  naproxen sodium (ALEVE) 220 MG tablet Take 220 mg by mouth daily as needed.   Yes [provider]  acetaminophen (TYLENOL) 500 MG tablet Take 500 mg by mouth every 6 (six) hours as needed.    [provider]  aspirin EC 81 MG tablet Take 1 tablet (81 mg total) by mouth daily. 01/31/17   End, Harrell Gave, MD  calcium carbonate (TUMS - DOSED IN MG ELEMENTAL CALCIUM) 500 MG chewable tablet Chew 2 tablets daily by mouth.    [provider]  cetirizine (ZYRTEC) 10 MG tablet Take 10 mg daily by mouth.    [provider]  cholecalciferol (VITAMIN D) 1000 units tablet Take 1,000 Units by mouth daily.    [provider]  HYDROcodone-acetaminophen (NORCO/VICODIN) 5-325 MG tablet Take 1-2 tablets every 6 (six) hours as needed by mouth for moderate pain or severe pain. Patient not taking:  Reported on 07/28/2017 07/04/17   Autumn Messing III, MD  Multiple Vitamin (MULTIVITAMIN) tablet Take 1 tablet by mouth daily.    [provider]  naproxen (NAPROSYN) 500 MG tablet Take 1 tablet (500 mg total) by mouth every 6 (six) hours as needed. 07/06/16   Marcial Pacas, MD  rosuvastatin (CRESTOR) 5 MG tablet Take 5 mg by mouth daily at 6 PM.    [provider]    Family History Family History  Problem Relation Age of Onset  . Heart disease Mother   . Hypertension Father   . Breast cancer Sister   . Breast cancer Paternal Aunt   . Breast cancer Maternal Aunt     Social History Social History   Tobacco Use  . Smoking status: Never Smoker  . Smokeless tobacco: Never Used  Substance Use Topics  . Alcohol use: Yes    Alcohol/week: 0.6 oz    Types: 1  Glasses of wine per week    Comment: social  . Drug use: No     Allergies   Patient has no known allergies.   Review of Systems Review of Systems  Constitutional: Negative for chills and fever.  Musculoskeletal: Negative for arthralgias and joint swelling.  Skin: Positive for wound (laceration over dorsal aspect of right index finger).  Neurological: Negative for weakness and numbness.     Physical Exam Updated Vital Signs BP (!) 143/70 (BP Location: Left Arm)   Pulse 66   Temp 98.3 F (36.8 C) (Oral)   Resp 20   Ht 5\' 3"  (1.6 m)   Wt 83.9 kg (185 lb)   SpO2 98%   BMI 32.77 kg/m   Physical Exam  Constitutional: She appears well-developed and well-nourished. No distress.  HENT:  Head: Normocephalic and atraumatic.  Eyes: Right eye exhibits no discharge. Left eye exhibits no discharge.  Pulmonary/Chest: Effort normal. No respiratory distress.  Musculoskeletal:       Hands: Approximately 2cm horizontal laceration over the dorsal aspect of the base of the right index finger. It is approximately 47mm deep. No surrounding erythema, warmth or swelling. No palpable foreign body. Full ROM of the index finger. Cap refill <2sec. Sensation to light touch intact beyond laceration.   Neurological: She is alert. Coordination normal.  Skin: She is not diaphoretic.  Psychiatric: She has a normal mood and affect. Her behavior is normal.  Nursing note and vitals reviewed.    ED Treatments / Results  Labs (all labs ordered are listed, but only abnormal results are displayed) Labs Reviewed - No data to display  EKG None  Radiology No results found.  Procedures .Marland KitchenLaceration Repair Date/Time: 01/14/2018 6:06 PM Performed by: Glyn Ade, PA-C Authorized by: Glyn Ade, PA-C   Consent:    Consent obtained:  Verbal   Consent given by:  Patient   Risks discussed:  Pain, infection, poor cosmetic result, poor wound healing and retained foreign body   Alternatives  discussed:  No treatment and delayed treatment Anesthesia (see MAR for exact dosages):    Anesthesia method:  Local infiltration   Local anesthetic:  Lidocaine 2% w/o epi Laceration details:    Location:  Finger   Finger location:  R index finger   Length (cm):  2   Depth (mm):  3 Repair type:    Repair type:  Simple Pre-procedure details:    Preparation:  Patient was prepped and draped in usual sterile fashion Exploration:    Hemostasis achieved with:  Direct  pressure   Wound exploration: wound explored through full range of motion and entire depth of wound probed and visualized     Contaminated: no   Treatment:    Area cleansed with:  Betadine   Amount of cleaning:  Extensive   Irrigation solution:  Sterile saline   Irrigation volume:  200ccs   Irrigation method:  Pressure wash Skin repair:    Repair method:  Sutures   Suture size:  4-0   Suture material:  Prolene   Suture technique:  Simple interrupted   Number of sutures:  4 Approximation:    Approximation:  Close Post-procedure details:    Dressing:  Non-adherent dressing   Patient tolerance of procedure:  Tolerated well, no immediate complications   (including critical care time)  Medications Ordered in ED Medications  lidocaine (XYLOCAINE) 2 % (with pres) injection 200 mg (200 mg Intradermal Given by Other 01/14/18 1722)  Tdap (BOOSTRIX) injection 0.5 mL (0.5 mLs Intramuscular Given 01/14/18 1716)     Initial Impression / Assessment and Plan / ED Course  I have reviewed the triage vital signs and the nursing notes.  Pertinent labs & imaging results that were available during my care of the patient were reviewed by me and considered in my medical decision making (see chart for details).     Pressure irrigation performed. Wound explored and base of wound visualized in a bloodless field without evidence of foreign body.  Laceration occurred < 8 hours prior to repair which was well tolerated. Tdap updated.  Pt has  no comorbidities to effect normal wound healing. Pt discharged without antibiotics.  Discussed suture home care with patient and answered questions. Pt to follow-up for wound check and suture removal in 7-10 days; she is to return to the ED sooner for signs of infection. Pt is hemodynamically stable with no complaints prior to dc. This was a shared visit with Dr. Gilford Raid who also saw the patient and agrees with plan and discharge home.   Final Clinical Impressions(s) / ED Diagnoses   Final diagnoses:  Laceration of right index finger without foreign body without damage to nail, initial encounter    ED Discharge Orders    None       Bernarda Caffey 01/14/18 1819    Isla Pence, MD 01/14/18 [phone]

## 2018-01-24 DIAGNOSIS — E7800 Pure hypercholesterolemia, unspecified: Secondary | ICD-10-CM | POA: Diagnosis not present

## 2018-01-24 DIAGNOSIS — M8588 Other specified disorders of bone density and structure, other site: Secondary | ICD-10-CM | POA: Diagnosis not present

## 2018-01-24 DIAGNOSIS — Z4802 Encounter for removal of sutures: Secondary | ICD-10-CM | POA: Diagnosis not present

## 2018-01-24 DIAGNOSIS — C50919 Malignant neoplasm of unspecified site of unspecified female breast: Secondary | ICD-10-CM | POA: Diagnosis not present

## 2018-01-24 DIAGNOSIS — M4800 Spinal stenosis, site unspecified: Secondary | ICD-10-CM | POA: Diagnosis not present

## 2018-01-24 DIAGNOSIS — R002 Palpitations: Secondary | ICD-10-CM | POA: Diagnosis not present

## 2018-02-01 DIAGNOSIS — R7301 Impaired fasting glucose: Secondary | ICD-10-CM | POA: Diagnosis not present

## 2018-02-01 DIAGNOSIS — R7989 Other specified abnormal findings of blood chemistry: Secondary | ICD-10-CM | POA: Diagnosis not present

## 2018-02-16 DIAGNOSIS — D0511 Intraductal carcinoma in situ of right breast: Secondary | ICD-10-CM | POA: Diagnosis not present

## 2018-03-17 DIAGNOSIS — R740 Nonspecific elevation of levels of transaminase and lactic acid dehydrogenase [LDH]: Secondary | ICD-10-CM | POA: Diagnosis not present

## 2018-03-31 ENCOUNTER — Encounter: Payer: Self-pay | Admitting: Nurse Practitioner

## 2018-03-31 ENCOUNTER — Telehealth: Payer: Self-pay | Admitting: Nurse Practitioner

## 2018-03-31 NOTE — Telephone Encounter (Signed)
New hematology referral received from Dr. Drema Dallas for abnormal cbc w/low cbc. Pt has not seen a medical oncologist. She has been scheduled to see [name] on 8/19 at 130pm. Pt aware to arrive early to be checked in on time. Letter mailed.

## 2018-04-10 ENCOUNTER — Telehealth: Payer: Self-pay | Admitting: Hematology

## 2018-04-10 ENCOUNTER — Inpatient Hospital Stay: Payer: Medicare HMO | Attending: Nurse Practitioner | Admitting: Nurse Practitioner

## 2018-04-10 ENCOUNTER — Encounter: Payer: Self-pay | Admitting: Nurse Practitioner

## 2018-04-10 VITALS — BP 126/60 | HR 64 | Temp 98.3°F | Resp 18 | Ht 63.0 in | Wt 179.7 lb

## 2018-04-10 DIAGNOSIS — D72819 Decreased white blood cell count, unspecified: Secondary | ICD-10-CM

## 2018-04-10 DIAGNOSIS — D72810 Lymphocytopenia: Secondary | ICD-10-CM | POA: Insufficient documentation

## 2018-04-10 DIAGNOSIS — Z803 Family history of malignant neoplasm of breast: Secondary | ICD-10-CM | POA: Diagnosis not present

## 2018-04-10 DIAGNOSIS — Z86000 Personal history of in-situ neoplasm of breast: Secondary | ICD-10-CM | POA: Insufficient documentation

## 2018-04-10 DIAGNOSIS — N951 Menopausal and female climacteric states: Secondary | ICD-10-CM

## 2018-04-10 DIAGNOSIS — D709 Neutropenia, unspecified: Secondary | ICD-10-CM | POA: Insufficient documentation

## 2018-04-10 DIAGNOSIS — Z923 Personal history of irradiation: Secondary | ICD-10-CM | POA: Diagnosis not present

## 2018-04-10 NOTE — Telephone Encounter (Signed)
Gave patient avs report and appointments for October 2019 thru February 2020. Central radiology will call re [place].

## 2018-04-10 NOTE — Progress Notes (Addendum)
[facility]  [phone] Fax:([phone]) [place] consult Note   Patient Care Team: Leighton Ruff, MD as PCP - General (Family Medicine) 04/10/2018  CHIEF COMPLAINTS/PURPOSE OF CONSULTATION:  Worsening leukopenia   HISTORY OF PRESENTING ILLNESS:  Caroline Wilson 72 y.o. female is here because of worsening leukopenia with predominant neutropenia. She was found to have abnormal CBC from 08/2014, WBC 3.2, ANC 1.1, and mild anemia with Hgb 11.7. The patient was not aware of her low blood count. Further work up in 06/2016 revealed normal B12 and TSH. CRP and sed rate were slightly elevated but normal on repeat in 2018. Negative ANA. On 12/2016 WBC was 3.9 with neutrophils 1.8 and normal lymphocytes; 6/19 CBC revealed WBC 2.9 with ANC 1.2 and WBC 2.4 and ANC 1.0 and lymphocytes decreased to 0.9. On 03/2018, CBC revealed WBC 2.8, ANC 1.1, and lymphocytes 1.0.   She denies recent or recurrent infections, no history of hepatitis, HIV, liver disease, or autoimmune disease such as RA or lupus. She has no known nutritional deficiencies, takes multivitamin and vitamin D. Denies chron's disease, UC, or GI disorders; no history of bariatric or GI surgeries. She is up to date on age-appropriate cancer screenings.   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 Marlou Starks. Pathology shows high grade DCIS spanning 2.4 cm; DCIS comes to within 0.1 cm of the inferior margin multifocally and 0.2-0.3 cm of the medial margin focally. ER/PR negative, HER2 unknown. She completed adjuvant radiation per Dr. Lisbeth Renshaw. She gets breast exam at gynecologist and will be due annual mammogram in 07/2018.   She had not noticed any recent bleeding such as epistaxis, hematuria or hematochezia. She takes naproxen sparingly for migraines, denies heavy over the counter NSAID ingestion. She is not on antiplatelets agents. Denies personal or family history of  blood disorder. No history of blood clots. Family history is positive for 2 aunts and 1 sister with breast cancer. She is married with 3 children who are healthy.  She denies fever, night sweats, or weight loss. Appetite is normal. Denies nausea, vomiting, constipation, or diarrhea. She has chronic hot flashes previously managed by Gyn. She has f/u next month.  MEDICAL HISTORY:  Past Medical History:  Diagnosis Date  . Arthritis    knees, neck  . Breast cancer ([place]) 06/06/2017   right breast DCIS  . H/O seasonal allergies   . Headache   . Hypercholesteremia   . Knee pain     SURGICAL HISTORY: Past Surgical History:  Procedure Laterality Date  . ABDOMINAL HYSTERECTOMY    . BREAST BIOPSY    . BREAST LUMPECTOMY WITH RADIOACTIVE SEED LOCALIZATION Right 07/04/2017   Procedure: BREAST LUMPECTOMY WITH RADIOACTIVE SEED LOCALIZATION;  Surgeon: Jovita Kussmaul, MD;  Location: [facility];  Service: General;  Laterality: Right;  . TUBAL LIGATION      SOCIAL HISTORY: Social History   Socioeconomic History  . Marital status: Married    Spouse name: Not on file  . Number of children: 3  . Years of education: 2 years college  . Highest education level: Not on file  Occupational History  . Occupation: Community education officer for [employer]  . Financial resource strain: Not on file  . Food insecurity:    Worry: Not on file    Inability: Not on file  . Transportation needs:    Medical: Not on file  Non-medical: Not on file  Tobacco Use  . Smoking status: Never Smoker  . Smokeless tobacco: Never Used  Substance and Sexual Activity  . Alcohol use: Yes    Alcohol/week: 1.0 standard drinks    Types: 1 Glasses of wine per week    Comment: social; never heavy use   . Drug use: No  . Sexual activity: Not on file  Lifestyle  . Physical activity:    Days per week: Not on file    Minutes per session: Not on file  . Stress: Not on file  Relationships  .  Social connections:    Talks on phone: Not on file    Gets together: Not on file    Attends religious service: Not on file    Active member of club or organization: Not on file    Attends meetings of clubs or organizations: Not on file    Relationship status: Not on file  . Intimate partner violence:    Fear of current or ex partner: Not on file    Emotionally abused: Not on file    Physically abused: Not on file    Forced sexual activity: Not on file  Other Topics Concern  . Not on file  Social History Narrative   Lives at home with husband.   Right-handed.   3 cups caffeine per day.    FAMILY HISTORY: Family History  Problem Relation Age of Onset  . Heart disease Mother   . Hypertension Father   . Breast cancer Sister   . Breast cancer Paternal Aunt   . Breast cancer Maternal Aunt     ALLERGIES:  has No Known Allergies.  MEDICATIONS:  Current Outpatient Medications  Medication Sig Dispense Refill  . calcium carbonate (TUMS - DOSED IN MG ELEMENTAL CALCIUM) 500 MG chewable tablet Chew 2 tablets daily by mouth.    . cholecalciferol (VITAMIN D) 1000 units tablet Take 1,000 Units by mouth daily.    . naproxen sodium (ALEVE) 220 MG tablet Take 220 mg by mouth daily as needed.    . rosuvastatin (CRESTOR) 5 MG tablet Take 5 mg by mouth daily at 6 PM.    . acetaminophen (TYLENOL) 500 MG tablet Take 500 mg by mouth every 6 (six) hours as needed.    Marland Kitchen aspirin EC 81 MG tablet Take 1 tablet (81 mg total) by mouth daily.    . cetirizine (ZYRTEC) 10 MG tablet Take 10 mg daily by mouth.    Marland Kitchen HYDROcodone-acetaminophen (NORCO/VICODIN) 5-325 MG tablet Take 1-2 tablets every 6 (six) hours as needed by mouth for moderate pain or severe pain. (Patient not taking: Reported on 07/28/2017) 20 tablet 0  . Multiple Vitamin (MULTIVITAMIN) tablet Take 1 tablet by mouth daily.    . naproxen (NAPROSYN) 500 MG tablet Take 1 tablet (500 mg total) by mouth every 6 (six) hours as needed. 20 tablet 6   No  current facility-administered medications for this visit.     REVIEW OF SYSTEMS:   Constitutional: Denies fevers, chills or abnormal night sweats or weight loss  Ears, nose, mouth, throat, and face: Denies mucositis or sore throat Respiratory: Denies cough, dyspnea or wheezes Cardiovascular: Denies palpitation, chest discomfort or lower extremity swelling Gastrointestinal:  Denies nausea, vomiting, constipation, diarrhea, hematochezia, heartburn or change in bowel habits Skin: Denies abnormal skin rashes Lymphatics: Denies new lymphadenopathy or easy bruising Neurological:Denies numbness, tingling or new weaknesses (+) hot flashes (+) h/o migraines  Behavioral/Psych: Mood is stable, no new changes  MSK: (+) arthritis knees and neck  All other systems were reviewed with the patient and are negative.  PHYSICAL EXAMINATION: ECOG PERFORMANCE STATUS: 0 - Asymptomatic  Vitals:   04/10/18 1329  BP: 126/60  Pulse: 64  Resp: 18  Temp: 98.3 F (36.8 C)  SpO2: 99%   Filed Weights   04/10/18 1329  Weight: 179 lb 11.2 oz (81.5 kg)    GENERAL:alert, no distress and comfortable SKIN:  no rashes or significant lesions EYES: sclera clear OROPHARYNX:no thrush or ulcers  LYMPH:  no palpable cervical, supraclavicular, or axillary lymphadenopathy  LUNGS: clear to auscultation with normal breathing effort HEART: regular rate & rhythm, no lower extremity edema ABDOMEN:abdomen soft, non-tender and normal bowel sounds Musculoskeletal:no cyanosis of digits and no clubbing  PSYCH: alert & oriented x 3 with fluent speech NEURO: no focal motor/sensory deficits  LABORATORY DATA:  I have reviewed the data as listed CBC Latest Ref Rng & Units 08/26/2014  WBC 4.0 - 10.5 K/uL 3.2(L)  Hemoglobin 12.0 - 15.0 g/dL 11.7(L)  Hematocrit 36.0 - 46.0 % 37.0  Platelets 150 - 400 K/uL 228    CMP Latest Ref Rng & Units 08/26/2014  Glucose 70 - 99 mg/dL 111(H)  BUN 6 - 23 mg/dL 19  Creatinine 0.50 - 1.10  mg/dL 0.79  Sodium 135 - 145 mmol/L 139  Potassium 3.5 - 5.1 mmol/L 4.0  Chloride 96 - 112 mEq/L 107  CO2 19 - 32 mmol/L 26  Calcium 8.4 - 10.5 mg/dL 9.0     RADIOGRAPHIC STUDIES: I have personally reviewed the radiological images as listed and agreed with the findings in the report. No results found.  ASSESSMENT & PLAN: 72 yo AAF   1. Leukopenia with neutropenia and lymphopenia  -we reviewed her medical record in detail with the patient and her family. The patient was seen with Dr. Burr Medico who reviewed potential causes of leukopenia such as infection, liver disease, autoimmune disease, nutritional deficiencies, heavy alcohol use, certain medication, ethnic predisposition, and bone marrow disorder. Patient is African American, which could contribute, but her leukopenia has gradually worsened   -will check hepatitis panel to r/o hep B and C and abdominal US to r/o liver disease.  -will check folate  -if work up is negative and CBC remains stable, we will observe her; if leukopenia and neutropenia continue to worsen, she may need a bone marrow biopsy  -she is encouraged to use good hand hygiene and get age-appropriate vaccinations to avoid infection -plan to repeat labs in 2 months x3 and f/u in 6 months, or sooner if needed   2. Hot flashes  -previously managed with medication per GYN, patient cannot recall name of drug  -she will f/u next month, and can resume therapy if needed. I recommend she avoid hormonal therapy given her history of DCIS.   PLAN: -Labs and medical record reviewed -Labs every 2 months x3 -F/u in 6 months, or sooner if needed  -Abdominal US in ~2 weeks; will call with results  All questions were answered. The patient knows to call the clinic with any problems, questions or concerns.   Alla Feeling, NP 04/10/18   Addendeum  I have seen the patient, examined her. I agree with the assessment and and plan and have edited the notes.   I have review Mrs.  Wilson's medical records including labs. She has had mild leukopenia, with mainly neutropenia for at least 3 years, slightly worse lately, the rest of CBC has been unremarkable.  She has no recurrent infections. NO prior history of chronic infection or liver disease. We discussed the potential cause of her mild to moderate neutropenia, such as benign neutropenia secondary to her ethnicity, autoimmune related neutropenia, primary bone marrow disease such as MDS. Will check HBV, HCV, HIV if she has not been tested, and get an abdominal US to evaluate her liver and spleen. Will consider bone marrow biopsy if neutropenia gets much worse of she develops other cytopenia. Will monitor her CBC and see her back in 6 months.  Truitt Merle  04/10/2018

## 2018-04-25 ENCOUNTER — Other Ambulatory Visit: Payer: Self-pay | Admitting: General Surgery

## 2018-04-25 DIAGNOSIS — Z853 Personal history of malignant neoplasm of breast: Secondary | ICD-10-CM

## 2018-04-28 ENCOUNTER — Ambulatory Visit (HOSPITAL_COMMUNITY)
Admission: RE | Admit: 2018-04-28 | Discharge: 2018-04-28 | Disposition: A | Discharge status: Home / Self Care | Payer: Medicare HMO | Source: Ambulatory Visit | Attending: Nurse Practitioner | Admitting: Nurse Practitioner

## 2018-04-28 DIAGNOSIS — D72819 Decreased white blood cell count, unspecified: Secondary | ICD-10-CM | POA: Diagnosis not present

## 2018-04-28 DIAGNOSIS — D709 Neutropenia, unspecified: Secondary | ICD-10-CM | POA: Insufficient documentation

## 2018-05-01 ENCOUNTER — Telehealth: Payer: Self-pay

## 2018-05-01 NOTE — Telephone Encounter (Signed)
Spoke with patient per [name] NP with abdominal US results, does not show any evidence of disease in the liver or spleen.  Instructed her to keep her lab appointment for next month and we will call her with results.  Patient verbalized an understanding.

## 2018-05-01 NOTE — Telephone Encounter (Signed)
-----   Message from Alla Feeling, NP sent at 04/28/2018  4:01 PM EDT ----- Please let her know abdominal US does not show evidence of disease in the liver or spleen. Keep lab appt next month and we will call with results.  Thanks, Regan Rakers NP

## 2018-05-15 DIAGNOSIS — Z124 Encounter for screening for malignant neoplasm of cervix: Secondary | ICD-10-CM | POA: Diagnosis not present

## 2018-05-15 DIAGNOSIS — Z79890 Hormone replacement therapy (postmenopausal): Secondary | ICD-10-CM | POA: Diagnosis not present

## 2018-05-15 DIAGNOSIS — M8580 Other specified disorders of bone density and structure, unspecified site: Secondary | ICD-10-CM | POA: Diagnosis not present

## 2018-05-15 DIAGNOSIS — Z23 Encounter for immunization: Secondary | ICD-10-CM | POA: Diagnosis not present

## 2018-05-26 ENCOUNTER — Ambulatory Visit: Payer: Medicare HMO

## 2018-05-26 ENCOUNTER — Ambulatory Visit
Admission: RE | Admit: 2018-05-26 | Discharge: 2018-05-26 | Disposition: A | Discharge status: Home / Self Care | Payer: Medicare HMO | Source: Ambulatory Visit | Attending: General Surgery | Admitting: General Surgery

## 2018-05-26 ENCOUNTER — Other Ambulatory Visit: Payer: Self-pay | Admitting: General Surgery

## 2018-05-26 DIAGNOSIS — R921 Mammographic calcification found on diagnostic imaging of breast: Secondary | ICD-10-CM

## 2018-05-26 DIAGNOSIS — R922 Inconclusive mammogram: Secondary | ICD-10-CM | POA: Diagnosis not present

## 2018-05-26 DIAGNOSIS — Z853 Personal history of malignant neoplasm of breast: Secondary | ICD-10-CM

## 2018-06-05 ENCOUNTER — Inpatient Hospital Stay: Payer: Medicare HMO | Attending: Nurse Practitioner

## 2018-06-05 DIAGNOSIS — D709 Neutropenia, unspecified: Secondary | ICD-10-CM | POA: Diagnosis not present

## 2018-06-05 LAB — CMP (CANCER CENTER ONLY)
ALT: 16 U/L (ref 0–44)
ANION GAP: 8 (ref 5–15)
AST: 24 U/L (ref 15–41)
Albumin: 3.7 g/dL (ref 3.5–5.0)
Alkaline Phosphatase: 58 U/L (ref 38–126)
BUN: 12 mg/dL (ref 8–23)
CHLORIDE: 108 mmol/L (ref 98–111)
CO2: 26 mmol/L (ref 22–32)
Calcium: 9.5 mg/dL (ref 8.9–10.3)
Creatinine: 0.81 mg/dL (ref 0.44–1.00)
Glucose, Bld: 94 mg/dL (ref 70–99)
POTASSIUM: 4.2 mmol/L (ref 3.5–5.1)
Sodium: 142 mmol/L (ref 135–145)
TOTAL PROTEIN: 7.6 g/dL (ref 6.5–8.1)
Total Bilirubin: 0.6 mg/dL (ref 0.3–1.2)

## 2018-06-05 LAB — CBC WITH DIFFERENTIAL (CANCER CENTER ONLY)
ABS IMMATURE GRANULOCYTES: 0.01 10*3/uL (ref 0.00–0.07)
BASOS ABS: 0 10*3/uL (ref 0.0–0.1)
BASOS PCT: 1 %
EOS PCT: 3 %
Eosinophils Absolute: 0.1 10*3/uL (ref 0.0–0.5)
HCT: 39.7 % (ref 36.0–46.0)
HEMOGLOBIN: 12.5 g/dL (ref 12.0–15.0)
Immature Granulocytes: 0 %
LYMPHS PCT: 42 %
Lymphs Abs: 1.2 10*3/uL (ref 0.7–4.0)
MCH: 28 pg (ref 26.0–34.0)
MCHC: 31.5 g/dL (ref 30.0–36.0)
MCV: 89 fL (ref 80.0–100.0)
Monocytes Absolute: 0.4 10*3/uL (ref 0.1–1.0)
Monocytes Relative: 13 %
NEUTROS ABS: 1.1 10*3/uL — AB (ref 1.7–7.7)
NRBC: 0 % (ref 0.0–0.2)
Neutrophils Relative %: 41 %
PLATELETS: 192 10*3/uL (ref 150–400)
RBC: 4.46 MIL/uL (ref 3.87–5.11)
RDW: 13.8 % (ref 11.5–15.5)
WBC: 2.8 10*3/uL — AB (ref 4.0–10.5)

## 2018-06-05 LAB — FOLATE: FOLATE: 33.2 ng/mL (ref >5.9)

## 2018-06-06 LAB — HCV COMMENT:

## 2018-06-06 LAB — HEPATITIS B SURFACE ANTIGEN: HEP B S AG: NEGATIVE

## 2018-06-06 LAB — HEPATITIS C ANTIBODY (REFLEX): HCV Ab: <0.1 s/co ratio (ref 0.0–0.9)

## 2018-06-09 DIAGNOSIS — Z0100 Encounter for examination of eyes and vision without abnormal findings: Secondary | ICD-10-CM | POA: Diagnosis not present

## 2018-06-12 ENCOUNTER — Telehealth: Payer: Self-pay

## 2018-06-12 ENCOUNTER — Telehealth: Payer: Self-pay | Admitting: Emergency Medicine

## 2018-06-12 NOTE — Telephone Encounter (Signed)
Left voice message for patient per Caroline Rue NP lab results show her white count/neutropenia is stable, folic acid, CMP and hepatitis panel are normal.  Please protect yourself from infection, good hand washing, get flu vaccine and we will repeat labs in December.

## 2018-06-12 NOTE — Telephone Encounter (Addendum)
VM left for patient to call back regarding this note   ----- Message from Alla Feeling, NP sent at 06/08/2018  3:16 PM EDT ----- Please let her know her white count/neutropenia is stable. Folic acid, CMP, and hepatitis panel are normal. Please review infection precautions, and recommend she get a flu vaccine. Keep labs appts in December and we will see her in 09/2018 as planned.  Thanks, Regan Rakers NP

## 2018-06-12 NOTE — Telephone Encounter (Signed)
-----   Message from Alla Feeling, NP sent at 06/08/2018  3:16 PM EDT ----- Please let her know her white count/neutropenia is stable. Folic acid, CMP, and hepatitis panel are normal. Please review infection precautions, and recommend she get a flu vaccine. Keep labs appts in December and we will see her in 09/2018 as planned.  Thanks, Regan Rakers NP

## 2018-07-27 DIAGNOSIS — D72819 Decreased white blood cell count, unspecified: Secondary | ICD-10-CM | POA: Diagnosis not present

## 2018-07-27 DIAGNOSIS — E7800 Pure hypercholesterolemia, unspecified: Secondary | ICD-10-CM | POA: Diagnosis not present

## 2018-07-27 DIAGNOSIS — E2839 Other primary ovarian failure: Secondary | ICD-10-CM | POA: Diagnosis not present

## 2018-07-27 DIAGNOSIS — R739 Hyperglycemia, unspecified: Secondary | ICD-10-CM | POA: Diagnosis not present

## 2018-07-27 DIAGNOSIS — C50919 Malignant neoplasm of unspecified site of unspecified female breast: Secondary | ICD-10-CM | POA: Diagnosis not present

## 2018-07-31 ENCOUNTER — Inpatient Hospital Stay: Payer: Medicare HMO | Attending: Nurse Practitioner

## 2018-07-31 DIAGNOSIS — D72819 Decreased white blood cell count, unspecified: Secondary | ICD-10-CM | POA: Diagnosis not present

## 2018-07-31 DIAGNOSIS — D72810 Lymphocytopenia: Secondary | ICD-10-CM | POA: Insufficient documentation

## 2018-07-31 DIAGNOSIS — D709 Neutropenia, unspecified: Secondary | ICD-10-CM | POA: Diagnosis not present

## 2018-07-31 LAB — CMP (CANCER CENTER ONLY)
ALBUMIN: 3.6 g/dL (ref 3.5–5.0)
ALK PHOS: 59 U/L (ref 38–126)
ALT: 16 U/L (ref 0–44)
AST: 24 U/L (ref 15–41)
Anion gap: 8 (ref 5–15)
BILIRUBIN TOTAL: 0.5 mg/dL (ref 0.3–1.2)
BUN: 12 mg/dL (ref 8–23)
CALCIUM: 9.6 mg/dL (ref 8.9–10.3)
CO2: 25 mmol/L (ref 22–32)
Chloride: 107 mmol/L (ref 98–111)
Creatinine: 0.8 mg/dL (ref 0.44–1.00)
GFR, Est AFR Am: >60 mL/min (ref >60)
GFR, Estimated: >60 mL/min (ref >60)
GLUCOSE: 92 mg/dL (ref 70–99)
POTASSIUM: 4.2 mmol/L (ref 3.5–5.1)
Sodium: 140 mmol/L (ref 135–145)
TOTAL PROTEIN: 7.7 g/dL (ref 6.5–8.1)

## 2018-07-31 LAB — CBC WITH DIFFERENTIAL (CANCER CENTER ONLY)
ABS IMMATURE GRANULOCYTES: 0.02 10*3/uL (ref 0.00–0.07)
BASOS PCT: 1 %
Basophils Absolute: 0 10*3/uL (ref 0.0–0.1)
Eosinophils Absolute: 0.1 10*3/uL (ref 0.0–0.5)
Eosinophils Relative: 3 %
HEMATOCRIT: 40.6 % (ref 36.0–46.0)
Hemoglobin: 12.7 g/dL (ref 12.0–15.0)
Immature Granulocytes: 1 %
Lymphocytes Relative: 35 %
Lymphs Abs: 1.1 10*3/uL (ref 0.7–4.0)
MCH: 27.8 pg (ref 26.0–34.0)
MCHC: 31.3 g/dL (ref 30.0–36.0)
MCV: 88.8 fL (ref 80.0–100.0)
MONO ABS: 0.4 10*3/uL (ref 0.1–1.0)
MONOS PCT: 14 %
NEUTROS ABS: 1.4 10*3/uL — AB (ref 1.7–7.7)
Neutrophils Relative %: 46 %
PLATELETS: 201 10*3/uL (ref 150–400)
RBC: 4.57 MIL/uL (ref 3.87–5.11)
RDW: 13.5 % (ref 11.5–15.5)
WBC Count: 3 10*3/uL — ABNORMAL LOW (ref 4.0–10.5)
nRBC: 0 % (ref 0.0–0.2)

## 2018-08-01 ENCOUNTER — Other Ambulatory Visit: Payer: Self-pay | Admitting: Family Medicine

## 2018-08-01 DIAGNOSIS — E2839 Other primary ovarian failure: Secondary | ICD-10-CM

## 2018-08-03 ENCOUNTER — Telehealth: Payer: Self-pay

## 2018-08-03 NOTE — Telephone Encounter (Signed)
TC to Pt. Left message to return call to [facility].

## 2018-08-04 ENCOUNTER — Telehealth: Payer: Self-pay | Admitting: *Deleted

## 2018-08-04 NOTE — Telephone Encounter (Signed)
Call made to patient on her cell phone. Spoke with patient. Reviewed recent labs with her. She voiced understanding. She had no questions or concerns. She is aware of her appt with Dr. Burr Medico in February 2020.

## 2018-08-04 NOTE — Telephone Encounter (Signed)
TCT patient to review recent labs. No answer but was able to leave message for pt to return call at her convenience.

## 2018-08-29 ENCOUNTER — Ambulatory Visit
Admission: RE | Admit: 2018-08-29 | Discharge: 2018-08-29 | Disposition: A | Discharge status: Home / Self Care | Payer: Medicare HMO | Source: Ambulatory Visit | Attending: Family Medicine | Admitting: Family Medicine

## 2018-08-29 DIAGNOSIS — Z780 Asymptomatic menopausal state: Secondary | ICD-10-CM | POA: Diagnosis not present

## 2018-08-29 DIAGNOSIS — E2839 Other primary ovarian failure: Secondary | ICD-10-CM

## 2018-08-29 DIAGNOSIS — M8588 Other specified disorders of bone density and structure, other site: Secondary | ICD-10-CM | POA: Diagnosis not present

## 2018-09-18 DIAGNOSIS — D0511 Intraductal carcinoma in situ of right breast: Secondary | ICD-10-CM | POA: Diagnosis not present

## 2018-09-25 ENCOUNTER — Inpatient Hospital Stay: Payer: Medicare HMO | Attending: Nurse Practitioner

## 2018-09-25 DIAGNOSIS — D72810 Lymphocytopenia: Secondary | ICD-10-CM | POA: Diagnosis not present

## 2018-09-25 DIAGNOSIS — Z79899 Other long term (current) drug therapy: Secondary | ICD-10-CM | POA: Diagnosis not present

## 2018-09-25 DIAGNOSIS — R232 Flushing: Secondary | ICD-10-CM | POA: Insufficient documentation

## 2018-09-25 DIAGNOSIS — D709 Neutropenia, unspecified: Secondary | ICD-10-CM

## 2018-09-25 DIAGNOSIS — D0511 Intraductal carcinoma in situ of right breast: Secondary | ICD-10-CM | POA: Insufficient documentation

## 2018-09-25 DIAGNOSIS — M8580 Other specified disorders of bone density and structure, unspecified site: Secondary | ICD-10-CM | POA: Diagnosis not present

## 2018-09-25 DIAGNOSIS — N951 Menopausal and female climacteric states: Secondary | ICD-10-CM | POA: Diagnosis not present

## 2018-09-25 DIAGNOSIS — Z791 Long term (current) use of non-steroidal anti-inflammatories (NSAID): Secondary | ICD-10-CM | POA: Insufficient documentation

## 2018-09-25 DIAGNOSIS — Z803 Family history of malignant neoplasm of breast: Secondary | ICD-10-CM | POA: Diagnosis not present

## 2018-09-25 DIAGNOSIS — Z171 Estrogen receptor negative status [ER-]: Secondary | ICD-10-CM | POA: Diagnosis not present

## 2018-09-25 DIAGNOSIS — Z923 Personal history of irradiation: Secondary | ICD-10-CM | POA: Diagnosis not present

## 2018-09-25 DIAGNOSIS — Z86000 Personal history of in-situ neoplasm of breast: Secondary | ICD-10-CM | POA: Insufficient documentation

## 2018-09-25 LAB — CMP (CANCER CENTER ONLY)
ALBUMIN: 3.6 g/dL (ref 3.5–5.0)
ALT: 15 U/L (ref 0–44)
AST: 19 U/L (ref 15–41)
Alkaline Phosphatase: 62 U/L (ref 38–126)
Anion gap: 5 (ref 5–15)
BUN: 16 mg/dL (ref 8–23)
CHLORIDE: 108 mmol/L (ref 98–111)
CO2: 28 mmol/L (ref 22–32)
CREATININE: 0.78 mg/dL (ref 0.44–1.00)
Calcium: 9.2 mg/dL (ref 8.9–10.3)
GFR, Estimated: >60 mL/min (ref >60)
GLUCOSE: 92 mg/dL (ref 70–99)
Potassium: 3.9 mmol/L (ref 3.5–5.1)
SODIUM: 141 mmol/L (ref 135–145)
Total Bilirubin: 0.5 mg/dL (ref 0.3–1.2)
Total Protein: 7.3 g/dL (ref 6.5–8.1)

## 2018-09-25 LAB — CBC WITH DIFFERENTIAL (CANCER CENTER ONLY)
Abs Immature Granulocytes: 0.01 10*3/uL (ref 0.00–0.07)
BASOS ABS: 0 10*3/uL (ref 0.0–0.1)
Basophils Relative: 1 %
EOS PCT: 4 %
Eosinophils Absolute: 0.1 10*3/uL (ref 0.0–0.5)
HEMATOCRIT: 37.5 % (ref 36.0–46.0)
HEMOGLOBIN: 12 g/dL (ref 12.0–15.0)
Immature Granulocytes: 0 %
LYMPHS ABS: 1.3 10*3/uL (ref 0.7–4.0)
LYMPHS PCT: 40 %
MCH: 28.4 pg (ref 26.0–34.0)
MCHC: 32 g/dL (ref 30.0–36.0)
MCV: 88.7 fL (ref 80.0–100.0)
Monocytes Absolute: 0.4 10*3/uL (ref 0.1–1.0)
Monocytes Relative: 11 %
NRBC: 0 % (ref 0.0–0.2)
Neutro Abs: 1.5 10*3/uL — ABNORMAL LOW (ref 1.7–7.7)
Neutrophils Relative %: 44 %
Platelet Count: 194 10*3/uL (ref 150–400)
RBC: 4.23 MIL/uL (ref 3.87–5.11)
RDW: 13.4 % (ref 11.5–15.5)
WBC Count: 3.3 10*3/uL — ABNORMAL LOW (ref 4.0–10.5)

## 2018-09-29 NOTE — Progress Notes (Signed)
Caroline Wilson   Telephone:([phone] Fax:(336) [phone]   Clinic Follow up Note   Patient Care Team: Leighton Ruff, MD as PCP - General (Family Medicine)  Date of Service:  10/02/2018  CHIEF COMPLAINT: F/u of Leukopenia and history of right breast DCIS   SUMMARY OF ONCOLOGIC HISTORY:   Ductal carcinoma in situ (DCIS) of right breast   05/30/2017 Mammogram    Diagnostic Mammogram 05/30/17  IMPRESSION Group of calcifications that span 1.6 x 2.8 x 1.2 cm in the upper inner quadrant of the right breast, for which malignancy cannot be excluded.     06/06/2017 Initial Biopsy    Diagnosis 06/06/17  Breast, right, needle core biopsy, upper inner right breast - DUCTAL CARCINOMA IN SITU WITH NECROSIS AND CALCIFICATIONS    06/06/2017 Receptors her2    Estrogen Receptor: 0%, NEGATIVE  Progesterone Receptor: 0%, NEGATIVE    06/21/2017 Initial Diagnosis    Ductal carcinoma in situ (DCIS) of right breast    09/06/2017 - 10/04/2017 Radiation Therapy    Adjuvant Radiation with Dr. Lisbeth Renshaw  Radiation treatment dates:  09/06/17-10/04/17 Site/dose:  1) Right breast/ 42.56 Gy in 16 fractions                         2) Right breast boost/ 10 Gy in 5 fractions  Beams/energy:   1) 3D/ 10X, 6X                                     2) Special teletherapy/ 15E  Narrative: The patient tolerated radiation treatment relatively well. The patient complained of sharp shooting pain and itching to the treatment area. She had hyperpigmentation without desquamation to the treatment area.       CURRENT THERAPY:  Observation   INTERVAL HISTORY:  Caroline Wilson is here for a follow up of Leukopenia. She was last seen by out clinic 6 months ago by NP Lacie. She presents to the clinic today with her husband. She notes she is doing well. She denies any signs of infection, cold or bronchitis since last visit. She notes she is up to date on her flu shot.  She notes she had 2 aunts and her a  sister who had breast cancer. She had not done genetic testing before. She notes she does have children, including a daughter.     REVIEW OF SYSTEMS:   Constitutional: Denies fevers, chills or abnormal weight loss Eyes: Denies blurriness of vision Ears, nose, mouth, throat, and face: Denies mucositis or sore throat Respiratory: Denies cough, dyspnea or wheezes Cardiovascular: Denies palpitation, chest discomfort or lower extremity swelling Gastrointestinal:  Denies nausea, heartburn or change in bowel habits Skin: Denies abnormal skin rashes Lymphatics: Denies new lymphadenopathy or easy bruising Neurological:Denies numbness, tingling or new weaknesses Behavioral/Psych: Mood is stable, no new changes  All other systems were reviewed with the patient and are negative.  MEDICAL HISTORY:  Past Medical History:  Diagnosis Date  . Arthritis    knees, neck  . Breast cancer ([place]) 06/06/2017   right breast DCIS  . H/O seasonal allergies   . Headache   . Hypercholesteremia   . Knee pain     SURGICAL HISTORY: Past Surgical History:  Procedure Laterality Date  . ABDOMINAL HYSTERECTOMY    . BREAST BIOPSY    . BREAST LUMPECTOMY WITH RADIOACTIVE SEED LOCALIZATION Right 07/04/2017   Procedure:  BREAST LUMPECTOMY WITH RADIOACTIVE SEED LOCALIZATION;  Surgeon: Jovita Kussmaul, MD;  Location: [facility];  Service: General;  Laterality: Right;  . TUBAL LIGATION      I have reviewed the social history and family history with the patient and they are unchanged from previous note.  ALLERGIES:  has No Known Allergies.  MEDICATIONS:  Current Outpatient Medications  Medication Sig Dispense Refill  . cholecalciferol (VITAMIN D) 1000 units tablet Take 1,000 Units by mouth daily.    . Multiple Vitamin (MULTIVITAMIN) tablet Take 1 tablet by mouth daily.    . naproxen (NAPROSYN) 500 MG tablet Take 1 tablet (500 mg total) by mouth every 6 (six) hours as needed. 20 tablet 6  . naproxen  sodium (ALEVE) 220 MG tablet Take 220 mg by mouth daily as needed.    Marland Kitchen PARoxetine (PAXIL) 10 MG tablet Take 10 mg by mouth daily.    . rosuvastatin (CRESTOR) 5 MG tablet Take 5 mg by mouth daily at 6 PM.     No current facility-administered medications for this visit.     PHYSICAL EXAMINATION: ECOG PERFORMANCE STATUS: 0 - Asymptomatic  Vitals:   10/02/18 0928  BP: 135/76  Pulse: 67  Resp: 17  Temp: 98.2 F (36.8 C)  SpO2: 99%   Filed Weights   10/02/18 0928  Weight: 180 lb 12.8 oz (82 kg)    GENERAL:alert, no distress and comfortable SKIN: skin color, texture, turgor are normal, no rashes or significant lesions EYES: normal, Conjunctiva are pink and non-injected, sclera clear OROPHARYNX:no exudate, no erythema and lips, buccal mucosa, and tongue normal  NECK: supple, thyroid normal size, non-tender, without nodularity LYMPH:  no palpable lymphadenopathy in the cervical, axillary or inguinal LUNGS: clear to auscultation and percussion with normal breathing effort HEART: regular rate & rhythm and no murmurs and no lower extremity edema ABDOMEN:abdomen soft, non-tender and normal bowel sounds Musculoskeletal:no cyanosis of digits and no clubbing  NEURO: alert & oriented x 3 with fluent speech, no focal motor/sensory deficits BREAST: s/p right lumpectomy: Surgical incision healed well with mild scar tissue (+) Skin almost back to normal color with mild skin hyperpigmentation post radiation (+) No palpable mass or adenopathy   LABORATORY DATA:  I have reviewed the data as listed CBC Latest Ref Rng & Units 09/25/2018 07/31/2018 06/05/2018  WBC 4.0 - 10.5 K/uL 3.3(L) 3.0(L) 2.8(L)  Hemoglobin 12.0 - 15.0 g/dL 12.0 12.7 12.5  Hematocrit 36.0 - 46.0 % 37.5 40.6 39.7  Platelets 150 - 400 K/uL 194 201 192     CMP Latest Ref Rng & Units 09/25/2018 07/31/2018 06/05/2018  Glucose 70 - 99 mg/dL 92 92 94  BUN 8 - 23 mg/dL _0 Creatinine 0.44 - 1.00 mg/dL 0.78 0.80 0.81  Sodium 135  - 145 mmol/L 141 140 142  Potassium 3.5 - 5.1 mmol/L 3.9 4.2 4.2  Chloride 98 - 111 mmol/L 108 107 108  CO2 22 - 32 mmol/L _1 Calcium 8.9 - 10.3 mg/dL 9.2 9.6 9.5  Total Protein 6.5 - 8.1 g/dL 7.3 7.7 7.6  Total Bilirubin 0.3 - 1.2 mg/dL 0.5 0.5 0.6  Alkaline Phos 38 - 126 U/L 62 59 58  AST 15 - 41 U/L _2 ALT 0 - 44 U/L _3 RADIOGRAPHIC STUDIES: I have personally reviewed the radiological images as listed and agreed with the findings in the report. No results found.   ASSESSMENT &  PLAN:  Abrar Koone is a 73 y.o. female with   1. 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  -Her lab workup was overall normal outside of Leukopenia. US abdomen was normal with no splenomegaly.  -this is likely autoimmune related or benign leukopenia due to her ethnicity   -Labs reviewed, her WBC has improved since last visit. WBC at 3.3 and ANC at 1.5 from 09/25/18.  -Will continue to monitor with follow ups and labs yearly and continue to follow up with her PCP in interim. If after a few year of observation, there is no change I will discharge her.  -She is up to date of flu vaccinations. I encouraged her to remain active, continue healthy diet to support a strong immune system.  -we discussed risk of infection and precautions to prevent  -F/u in 04/2019 and then yearly.    2. Hot flashes  -On Paxil -previously managed with medication per GYN, patient cannot recall name of drug  -she will f/u next month, and can resume therapy if needed. I recommend she avoid hormonal therapy given her history of DCIS.   3. H/o of right breast DCIS, ER/PR negative -Diagnosed in 05/2017 and treated with Lumpectomy and radiation.   -Her 05/2018 mammogram showed calcifications, likely benign. Will repeat in 11/2018 -Given her family history of Breast cancer (2 aunts and sister), she is eligible for genetic testing. She is interested. I  will refer to genetics.  -No palpable mass on exam today (10/02/18)   4. Osteopenia  -08/29/18 DEXA shows osteopenia with lowest T-score of -1.2 at AP spine  -continue calcium and vitD supplement    PLAN: -I will copy notes to Dr. Drema Dallas, her PCP  -Genetic referral for breast cancer  -Lab and f/u in Sep 2020, then yearly after   No problem-specific Assessment & Plan notes found for this encounter.   Orders Placed This Encounter  Procedures  . Ambulatory referral to Genetics    Referral Priority:   Routine    Referral Type:   Consultation    Referral Reason:   Specialty Services Required    Number of Visits Requested:   1  . Ambulatory referral to Genetics    Referral Priority:   Routine    Referral Type:   Consultation    Referral Reason:   Specialty Services Required    Number of Visits Requested:   1   All questions were answered. The patient knows to call the clinic with any problems, questions or concerns. No barriers to learning was detected. I spent 20 minutes counseling the patient face to face. The total time spent in the appointment was 25 minutes and more than 50% was on counseling and review of test results     Truitt Merle, MD 10/02/2018   I, Joslyn Devon, am acting as scribe for Truitt Merle, MD.   I have reviewed the above documentation for accuracy and completeness, and I agree with the above.

## 2018-10-02 ENCOUNTER — Inpatient Hospital Stay (HOSPITAL_BASED_OUTPATIENT_CLINIC_OR_DEPARTMENT_OTHER): Payer: Medicare HMO | Admitting: Hematology

## 2018-10-02 ENCOUNTER — Telehealth: Payer: Self-pay | Admitting: Hematology

## 2018-10-02 ENCOUNTER — Encounter: Payer: Self-pay | Admitting: Hematology

## 2018-10-02 VITALS — BP 135/76 | HR 67 | Temp 98.2°F | Resp 17 | Ht 62.0 in | Wt 180.8 lb

## 2018-10-02 DIAGNOSIS — D0511 Intraductal carcinoma in situ of right breast: Secondary | ICD-10-CM | POA: Diagnosis not present

## 2018-10-02 DIAGNOSIS — Z171 Estrogen receptor negative status [ER-]: Secondary | ICD-10-CM | POA: Diagnosis not present

## 2018-10-02 DIAGNOSIS — Z791 Long term (current) use of non-steroidal anti-inflammatories (NSAID): Secondary | ICD-10-CM | POA: Diagnosis not present

## 2018-10-02 DIAGNOSIS — D72819 Decreased white blood cell count, unspecified: Secondary | ICD-10-CM | POA: Diagnosis not present

## 2018-10-02 DIAGNOSIS — D72810 Lymphocytopenia: Secondary | ICD-10-CM

## 2018-10-02 DIAGNOSIS — Z86000 Personal history of in-situ neoplasm of breast: Secondary | ICD-10-CM

## 2018-10-02 DIAGNOSIS — D709 Neutropenia, unspecified: Secondary | ICD-10-CM

## 2018-10-02 DIAGNOSIS — R232 Flushing: Secondary | ICD-10-CM | POA: Diagnosis not present

## 2018-10-02 DIAGNOSIS — Z803 Family history of malignant neoplasm of breast: Secondary | ICD-10-CM

## 2018-10-02 DIAGNOSIS — N951 Menopausal and female climacteric states: Secondary | ICD-10-CM | POA: Diagnosis not present

## 2018-10-02 DIAGNOSIS — Z923 Personal history of irradiation: Secondary | ICD-10-CM | POA: Diagnosis not present

## 2018-10-02 DIAGNOSIS — M8580 Other specified disorders of bone density and structure, unspecified site: Secondary | ICD-10-CM | POA: Diagnosis not present

## 2018-10-02 NOTE — Telephone Encounter (Deleted)
Scheduled appt per 2/10 los.  Printed calendar and avs.  Reminded MD to put referral in for genetic.

## 2018-10-02 NOTE — Telephone Encounter (Addendum)
Scheduled appt per 2/10 los.  Printed calendar and avs.  Scheduled genetic counselor.

## 2018-10-14 DIAGNOSIS — J069 Acute upper respiratory infection, unspecified: Secondary | ICD-10-CM | POA: Diagnosis not present

## 2018-10-14 DIAGNOSIS — J209 Acute bronchitis, unspecified: Secondary | ICD-10-CM | POA: Diagnosis not present

## 2018-10-23 ENCOUNTER — Inpatient Hospital Stay: Payer: Medicare HMO | Attending: Nurse Practitioner | Admitting: Genetic Counselor

## 2018-10-27 DIAGNOSIS — E7800 Pure hypercholesterolemia, unspecified: Secondary | ICD-10-CM | POA: Diagnosis not present

## 2018-10-27 DIAGNOSIS — R159 Full incontinence of feces: Secondary | ICD-10-CM | POA: Diagnosis not present

## 2018-11-01 DIAGNOSIS — R159 Full incontinence of feces: Secondary | ICD-10-CM | POA: Diagnosis not present

## 2018-11-16 DIAGNOSIS — D72819 Decreased white blood cell count, unspecified: Secondary | ICD-10-CM | POA: Diagnosis not present

## 2018-11-16 DIAGNOSIS — E7800 Pure hypercholesterolemia, unspecified: Secondary | ICD-10-CM | POA: Diagnosis not present

## 2018-11-16 DIAGNOSIS — R7301 Impaired fasting glucose: Secondary | ICD-10-CM | POA: Diagnosis not present

## 2018-11-27 ENCOUNTER — Other Ambulatory Visit: Payer: Self-pay | Admitting: General Surgery

## 2018-11-27 ENCOUNTER — Ambulatory Visit
Admission: RE | Admit: 2018-11-27 | Discharge: 2018-11-27 | Disposition: A | Discharge status: Home / Self Care | Payer: Medicare HMO | Source: Ambulatory Visit | Attending: General Surgery | Admitting: General Surgery

## 2018-11-27 ENCOUNTER — Other Ambulatory Visit: Payer: Self-pay

## 2018-11-27 DIAGNOSIS — R921 Mammographic calcification found on diagnostic imaging of breast: Secondary | ICD-10-CM

## 2018-12-27 DIAGNOSIS — M7918 Myalgia, other site: Secondary | ICD-10-CM | POA: Diagnosis not present

## 2019-01-26 DIAGNOSIS — R159 Full incontinence of feces: Secondary | ICD-10-CM | POA: Diagnosis not present

## 2019-01-26 DIAGNOSIS — K6289 Other specified diseases of anus and rectum: Secondary | ICD-10-CM | POA: Diagnosis not present

## 2019-01-30 DIAGNOSIS — K6289 Other specified diseases of anus and rectum: Secondary | ICD-10-CM | POA: Diagnosis not present

## 2019-02-16 DIAGNOSIS — E7800 Pure hypercholesterolemia, unspecified: Secondary | ICD-10-CM | POA: Diagnosis not present

## 2019-02-16 DIAGNOSIS — R7301 Impaired fasting glucose: Secondary | ICD-10-CM | POA: Diagnosis not present

## 2019-02-16 DIAGNOSIS — D72819 Decreased white blood cell count, unspecified: Secondary | ICD-10-CM | POA: Diagnosis not present

## 2019-04-10 ENCOUNTER — Other Ambulatory Visit: Payer: Self-pay | Admitting: *Deleted

## 2019-04-10 DIAGNOSIS — Z20822 Contact with and (suspected) exposure to covid-19: Secondary | ICD-10-CM

## 2019-04-11 LAB — NOVEL CORONAVIRUS, NAA: SARS-CoV-2, NAA: NOT DETECTED

## 2019-04-27 NOTE — Progress Notes (Signed)
[place]   [phone]) [phone] [phone]) [phone]   Clinic Follow up Note   Patient Care Team: Leighton Ruff, MD as PCP - General (Family Medicine)  Date of Service:  05/04/2019  CHIEF COMPLAINT: F/u of Leukopenia and history of right breast DCIS   SUMMARY OF ONCOLOGIC HISTORY: Oncology History  Ductal carcinoma in situ (DCIS) of right breast  05/30/2017 Mammogram   Diagnostic Mammogram 05/30/17  IMPRESSION Group of calcifications that span 1.6 x 2.8 x 1.2 cm in the upper inner quadrant of the right breast, for which malignancy cannot be excluded.    06/06/2017 Initial Biopsy   Diagnosis 06/06/17  Breast, right, needle core biopsy, upper inner right breast - DUCTAL CARCINOMA IN SITU WITH NECROSIS AND CALCIFICATIONS   06/06/2017 Receptors her2   Estrogen Receptor: 0%, NEGATIVE  Progesterone Receptor: 0%, NEGATIVE   06/21/2017 Initial Diagnosis   Ductal carcinoma in situ (DCIS) of right breast   09/06/2017 - 10/04/2017 Radiation Therapy   Adjuvant Radiation with Dr. Lisbeth Renshaw  Radiation treatment dates:  09/06/17-10/04/17 Site/dose:  1) Right breast/ 42.56 Gy in 16 fractions                         2) Right breast boost/ 10 Gy in 5 fractions  Beams/energy:   1) 3D/ 10X, 6X                                     2) Special teletherapy/ 15E  Narrative: The patient tolerated radiation treatment relatively well. The patient complained of sharp shooting pain and itching to the treatment area. She had hyperpigmentation without desquamation to the treatment area.       CURRENT THERAPY:  Observation  INTERVAL HISTORY:  Caroline Wilson is here for a follow up of leukopenia. She was last seen by me 7 months ago. She presents to the clinic alone. She notes she is doing well with no new changes. She has been trying to eat better lately.    REVIEW OF SYSTEMS:   Constitutional: Denies fevers, chills or abnormal weight loss Eyes: Denies blurriness of vision  Ears, nose, mouth, throat, and face: Denies mucositis or sore throat Respiratory: Denies cough, dyspnea or wheezes Cardiovascular: Denies palpitation, chest discomfort or lower extremity swelling Gastrointestinal:  Denies nausea, heartburn or change in bowel habits Skin: Denies abnormal skin rashes Lymphatics: Denies new lymphadenopathy or easy bruising Neurological:Denies numbness, tingling or new weaknesses Behavioral/Psych: Mood is stable, no new changes  All other systems were reviewed with the patient and are negative.  MEDICAL HISTORY:  Past Medical History:  Diagnosis Date  . Arthritis    knees, neck  . Breast cancer ([place]) 06/06/2017   right breast DCIS  . H/O seasonal allergies   . Headache   . Hypercholesteremia   . Knee pain     SURGICAL HISTORY: Past Surgical History:  Procedure Laterality Date  . ABDOMINAL HYSTERECTOMY    . BREAST BIOPSY    . BREAST LUMPECTOMY WITH RADIOACTIVE SEED LOCALIZATION Right 07/04/2017   Procedure: BREAST LUMPECTOMY WITH RADIOACTIVE SEED LOCALIZATION;  Surgeon: Jovita Kussmaul, MD;  Location: [facility];  Service: General;  Laterality: Right;  . TUBAL LIGATION      I have reviewed the social history and family history with the patient and they are unchanged from previous note.  ALLERGIES:  has No Known Allergies.  MEDICATIONS:  Current Outpatient Medications  Medication Sig Dispense Refill  . cholecalciferol (VITAMIN D) 1000 units tablet Take 1,000 Units by mouth daily.    . Multiple Vitamin (MULTIVITAMIN) tablet Take 1 tablet by mouth daily.    Marland Kitchen PARoxetine (PAXIL) 10 MG tablet Take 10 mg by mouth daily.    . psyllium (METAMUCIL) 58.6 % packet Take 1 packet by mouth daily.    . rosuvastatin (CRESTOR) 5 MG tablet Take 5 mg by mouth daily at 6 PM.    . Turmeric (QC TUMERIC COMPLEX PO) Take by mouth.    . naproxen (NAPROSYN) 500 MG tablet Take 1 tablet (500 mg total) by mouth every 6 (six) hours as needed. (Patient not  taking: Reported on 05/04/2019) 20 tablet 6  . naproxen sodium (ALEVE) 220 MG tablet Take 220 mg by mouth daily as needed.    Marland Kitchen tiZANidine (ZANAFLEX) 2 MG tablet TAKE 1 TABLET BY MOUTH EVERY 8 HOURS AS NEEDED FOR MUSCLE SPASM     No current facility-administered medications for this visit.     PHYSICAL EXAMINATION: ECOG PERFORMANCE STATUS: 0 - Asymptomatic GENERAL:alert, no distress and comfortable SKIN: skin color, texture, turgor are normal, no rashes or significant lesions EYES: normal, Conjunctiva are pink and non-injected, sclera clear  NECK: supple, thyroid normal size, non-tender, without nodularity LYMPH:  no palpable lymphadenopathy in the cervical, axillary  LUNGS: clear to auscultation and percussion with normal breathing effort HEART: regular rate & rhythm and no murmurs and no lower extremity edema ABDOMEN:abdomen soft, non-tender and normal bowel sounds Musculoskeletal:no cyanosis of digits and no clubbing  NEURO: alert & oriented x 3 with fluent speech, no focal motor/sensory deficits BREAST: S/p right lumpectomy: Surgical incision healed well with very mild scar tissue. No palpable mass, nodules or adenopathy bilaterally. Breast exam benign.   LABORATORY DATA:  I have reviewed the data as listed CBC Latest Ref Rng & Units 05/04/2019 09/25/2018 07/31/2018  WBC 4.0 - 10.5 K/uL 4.0 3.3(L) 3.0(L)  Hemoglobin 12.0 - 15.0 g/dL 12.6 12.0 12.7  Hematocrit 36.0 - 46.0 % 40.1 37.5 40.6  Platelets 150 - 400 K/uL 184 194 201     CMP Latest Ref Rng & Units 05/04/2019 09/25/2018 07/31/2018  Glucose 70 - 99 mg/dL 101(H) 92 92  BUN 8 - 23 mg/dL '16 16 12  ' Creatinine 0.44 - 1.00 mg/dL 0.83 0.78 0.80  Sodium 135 - 145 mmol/L 140 141 140  Potassium 3.5 - 5.1 mmol/L 4.2 3.9 4.2  Chloride 98 - 111 mmol/L 108 108 107  CO2 22 - 32 mmol/L '26 28 25  ' Calcium 8.9 - 10.3 mg/dL 9.0 9.2 9.6  Total Protein 6.5 - 8.1 g/dL 7.5 7.3 7.7  Total Bilirubin 0.3 - 1.2 mg/dL 0.4 0.5 0.5  Alkaline Phos 38 -  126 U/L 64 62 59  AST 15 - 41 U/L '26 19 24  ' ALT 0 - 44 U/L '20 15 16      ' RADIOGRAPHIC STUDIES: I have personally reviewed the radiological images as listed and agreed with the findings in the report. No results found.   ASSESSMENT & PLAN:  Caroline Wilson is a 73 y.o. female with   1. 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  -Her lab workup was overall normal outside of Leukopenia. US abdomen was normal with no splenomegaly.  -this is likely autoimmune related or benign leukopenia due to her ethnicity   -Labs reviewed, CBC and CMP WNL  except ANC 1.6, BG 101. Her WBC has been normal lately. This is likely benign.  -I encouraged her to watch for signs of infection such as fever, chills and that she should see her PCP if indicated. I encouraged her to continue COVID-19 precautions.  -I also encouraged her to continue to eat healthy and exercise regularly. She is fine to continue Tumeric supplement.  -She will proceed with f/u with her PCP. F/u with me as needed in the future.   2. H/o of right breast DCIS, ER/PR negative -Diagnosed in 05/2017 and treated with right Lumpectomy and radiation.   -Given her family history of Breast cancer (2 aunts and sister), she is eligible for genetic testing.  -Her 05/2018 mammogram showed calcifications, likely benign. Calcifications stable on 11/2018 mammogram. Next on 05/29/19 for routine f/u.  -No palpable mass on exam today (05/04/19). She will continue to f/u with her surgeon.   3. Osteopenia  -08/29/18 DEXA shows osteopenia with lowest T-score of -1.2 at [facility] spine  -continue calcium and vitD supplement   4. Elevated BP  -Today her BP at 139/94 -I encouraged her to watch at home and if persistently elevated she should see her PCP.    PLAN: -lab reviewed, WBC normal today  -Continue to f/u with PCP and her breast surgeon Dr. Marlou Starks  -F/u with me as needed in the future     No  problem-specific Assessment & Plan notes found for this encounter.   No orders of the defined types were placed in this encounter.  All questions were answered. The patient knows to call the clinic with any problems, questions or concerns. No barriers to learning was detected. I spent 15 minutes counseling the patient face to face. The total time spent in the appointment was 20 minutes and more than 50% was on counseling and review of test results     Truitt Merle, MD 05/04/2019   I, Joslyn Devon, am acting as scribe for Truitt Merle, MD.   I have reviewed the above documentation for accuracy and completeness, and I agree with the above.

## 2019-05-03 ENCOUNTER — Other Ambulatory Visit: Payer: Self-pay

## 2019-05-03 DIAGNOSIS — D0511 Intraductal carcinoma in situ of right breast: Secondary | ICD-10-CM

## 2019-05-04 ENCOUNTER — Other Ambulatory Visit: Payer: Self-pay

## 2019-05-04 ENCOUNTER — Inpatient Hospital Stay: Payer: Medicare HMO | Attending: Genetic Counselor

## 2019-05-04 ENCOUNTER — Telehealth: Payer: Self-pay | Admitting: Hematology

## 2019-05-04 ENCOUNTER — Encounter: Payer: Self-pay | Admitting: Hematology

## 2019-05-04 ENCOUNTER — Inpatient Hospital Stay: Payer: Medicare HMO | Admitting: Hematology

## 2019-05-04 DIAGNOSIS — Z923 Personal history of irradiation: Secondary | ICD-10-CM | POA: Diagnosis not present

## 2019-05-04 DIAGNOSIS — Z86000 Personal history of in-situ neoplasm of breast: Secondary | ICD-10-CM | POA: Insufficient documentation

## 2019-05-04 DIAGNOSIS — D0511 Intraductal carcinoma in situ of right breast: Secondary | ICD-10-CM | POA: Diagnosis not present

## 2019-05-04 DIAGNOSIS — D72810 Lymphocytopenia: Secondary | ICD-10-CM | POA: Diagnosis not present

## 2019-05-04 DIAGNOSIS — R030 Elevated blood-pressure reading, without diagnosis of hypertension: Secondary | ICD-10-CM | POA: Diagnosis not present

## 2019-05-04 DIAGNOSIS — M8580 Other specified disorders of bone density and structure, unspecified site: Secondary | ICD-10-CM | POA: Diagnosis not present

## 2019-05-04 LAB — CBC WITH DIFFERENTIAL (CANCER CENTER ONLY)
Abs Immature Granulocytes: 0.02 10*3/uL (ref 0.00–0.07)
Basophils Absolute: 0 10*3/uL (ref 0.0–0.1)
Basophils Relative: 1 %
Eosinophils Absolute: 0.1 10*3/uL (ref 0.0–0.5)
Eosinophils Relative: 3 %
HCT: 40.1 % (ref 36.0–46.0)
Hemoglobin: 12.6 g/dL (ref 12.0–15.0)
Immature Granulocytes: 1 %
Lymphocytes Relative: 45 %
Lymphs Abs: 1.8 10*3/uL (ref 0.7–4.0)
MCH: 28 pg (ref 26.0–34.0)
MCHC: 31.4 g/dL (ref 30.0–36.0)
MCV: 89.1 fL (ref 80.0–100.0)
Monocytes Absolute: 0.5 10*3/uL (ref 0.1–1.0)
Monocytes Relative: 11 %
Neutro Abs: 1.6 10*3/uL — ABNORMAL LOW (ref 1.7–7.7)
Neutrophils Relative %: 39 %
Platelet Count: 184 10*3/uL (ref 150–400)
RBC: 4.5 MIL/uL (ref 3.87–5.11)
RDW: 13.7 % (ref 11.5–15.5)
WBC Count: 4 10*3/uL (ref 4.0–10.5)
nRBC: 0 % (ref 0.0–0.2)

## 2019-05-04 LAB — CMP (CANCER CENTER ONLY)
ALT: 20 U/L (ref 0–44)
AST: 26 U/L (ref 15–41)
Albumin: 3.8 g/dL (ref 3.5–5.0)
Alkaline Phosphatase: 64 U/L (ref 38–126)
Anion gap: 6 (ref 5–15)
BUN: 16 mg/dL (ref 8–23)
CO2: 26 mmol/L (ref 22–32)
Calcium: 9 mg/dL (ref 8.9–10.3)
Chloride: 108 mmol/L (ref 98–111)
Creatinine: 0.83 mg/dL (ref 0.44–1.00)
GFR, Est AFR Am: >60 mL/min (ref >60)
GFR, Estimated: >60 mL/min (ref >60)
Glucose, Bld: 101 mg/dL — ABNORMAL HIGH (ref 70–99)
Potassium: 4.2 mmol/L (ref 3.5–5.1)
Sodium: 140 mmol/L (ref 135–145)
Total Bilirubin: 0.4 mg/dL (ref 0.3–1.2)
Total Protein: 7.5 g/dL (ref 6.5–8.1)

## 2019-05-04 NOTE — Telephone Encounter (Signed)
Per 9/10 los F/u open

## 2019-05-21 DIAGNOSIS — M79672 Pain in left foot: Secondary | ICD-10-CM | POA: Diagnosis not present

## 2019-05-21 DIAGNOSIS — E7800 Pure hypercholesterolemia, unspecified: Secondary | ICD-10-CM | POA: Diagnosis not present

## 2019-05-21 DIAGNOSIS — Z23 Encounter for immunization: Secondary | ICD-10-CM | POA: Diagnosis not present

## 2019-05-24 DIAGNOSIS — Z01419 Encounter for gynecological examination (general) (routine) without abnormal findings: Secondary | ICD-10-CM | POA: Diagnosis not present

## 2019-05-29 ENCOUNTER — Other Ambulatory Visit: Payer: Self-pay

## 2019-05-29 ENCOUNTER — Other Ambulatory Visit: Payer: Self-pay | Admitting: General Surgery

## 2019-05-29 ENCOUNTER — Ambulatory Visit
Admission: RE | Admit: 2019-05-29 | Discharge: 2019-05-29 | Disposition: A | Discharge status: Home / Self Care | Payer: Medicare HMO | Source: Ambulatory Visit | Attending: General Surgery | Admitting: General Surgery

## 2019-05-29 DIAGNOSIS — R921 Mammographic calcification found on diagnostic imaging of breast: Secondary | ICD-10-CM

## 2019-05-29 DIAGNOSIS — R920 Mammographic microcalcification found on diagnostic imaging of breast: Secondary | ICD-10-CM | POA: Diagnosis not present

## 2019-05-30 ENCOUNTER — Ambulatory Visit
Admission: RE | Admit: 2019-05-30 | Discharge: 2019-05-30 | Disposition: A | Discharge status: Home / Self Care | Payer: Medicare HMO | Source: Ambulatory Visit | Attending: General Surgery | Admitting: General Surgery

## 2019-05-30 DIAGNOSIS — R921 Mammographic calcification found on diagnostic imaging of breast: Secondary | ICD-10-CM | POA: Diagnosis not present

## 2019-05-30 DIAGNOSIS — R920 Mammographic microcalcification found on diagnostic imaging of breast: Secondary | ICD-10-CM | POA: Diagnosis not present

## 2019-05-30 DIAGNOSIS — N6489 Other specified disorders of breast: Secondary | ICD-10-CM | POA: Diagnosis not present

## 2019-05-31 ENCOUNTER — Ambulatory Visit (INDEPENDENT_AMBULATORY_CARE_PROVIDER_SITE_OTHER): Payer: Medicare HMO

## 2019-05-31 ENCOUNTER — Other Ambulatory Visit: Payer: Self-pay | Admitting: Podiatry

## 2019-05-31 ENCOUNTER — Encounter: Payer: Self-pay | Admitting: Podiatry

## 2019-05-31 ENCOUNTER — Ambulatory Visit: Payer: Medicare HMO | Admitting: Podiatry

## 2019-05-31 ENCOUNTER — Other Ambulatory Visit: Payer: Self-pay

## 2019-05-31 VITALS — BP 133/65

## 2019-05-31 DIAGNOSIS — M79672 Pain in left foot: Secondary | ICD-10-CM | POA: Diagnosis not present

## 2019-05-31 DIAGNOSIS — M76812 Anterior tibial syndrome, left leg: Secondary | ICD-10-CM

## 2019-05-31 NOTE — Patient Instructions (Signed)

## 2019-05-31 NOTE — Progress Notes (Signed)
Subjective:   Patient ID: Caroline Wilson, female   DOB: 73 y.o.   MRN: [id number]   HPI Patient states that pain in the left foot has been present for several months and she does not remember injury.  She is tried ankle supports and supportive shoes without relief of symptoms and states that it is been going on for around 4 months.  Patient does not smoke likes to be active   Review of Systems  All other systems reviewed and are negative.       Objective:  Physical Exam Vitals signs and nursing note reviewed.  Constitutional:      Appearance: She is well-developed.  Pulmonary:     Effort: Pulmonary effort is normal.  Musculoskeletal: Normal range of motion.  Skin:    General: Skin is warm.  Neurological:     Mental Status: She is alert.     Neurovascular status intact muscle strength found to be adequate range of motion within normal limits with patient noted to have quite a bit of discomfort on the dorsal left foot around the anterior tibial tendon as it inserts into the medial side of the foot.  Patient had no muscle strength loss and the tendon appears to be functioning in a normal fashion.  Patient has good digital perfusion well oriented x3     Assessment:  What appears to be anterior tibial tendinitis left with inflammation fluid buildup     Plan:  H&P condition reviewed and today I did careful sheath injection left 3 mg dexamethasone Kenalog 5 mg Xylocaine advised on rupture that we may need to get MRI and there may be a sheath tear and applied fascial brace to lift up the arch along with supportive shoes.  Reappoint for [place] to recheck again in 3 weeks  X-rays indicate that there is no indications of fracture or other bone pathology

## 2019-06-21 ENCOUNTER — Ambulatory Visit: Payer: Medicare HMO | Admitting: Podiatry

## 2019-06-21 ENCOUNTER — Encounter: Payer: Self-pay | Admitting: Podiatry

## 2019-06-21 ENCOUNTER — Other Ambulatory Visit: Payer: Self-pay

## 2019-06-21 DIAGNOSIS — M76812 Anterior tibial syndrome, left leg: Secondary | ICD-10-CM | POA: Diagnosis not present

## 2019-06-21 DIAGNOSIS — M79672 Pain in left foot: Secondary | ICD-10-CM | POA: Diagnosis not present

## 2019-06-21 NOTE — Progress Notes (Signed)
Subjective:   Patient ID: Caroline Wilson, female   DOB: 73 y.o.   MRN: [id number]   HPI Patient states that not as much pain as pre or previous with mild discomfort still noted upon deep palpation to the tendon   ROS      Objective:  Physical Exam  Neurovascular status is found to be intact with discomfort plantar left that is improved but is still present upon deep palpation     Assessment:  Plantar fasciitis left improved but still present     Plan:  Reviewed plantar fasciitis discussed the different treatment options available and discussed the long-term prognosis.  At this point I am allowing patient to return to normal activity and patient will be seen back as needed for evaluation and treatment

## 2019-09-21 DIAGNOSIS — M546 Pain in thoracic spine: Secondary | ICD-10-CM | POA: Diagnosis not present

## 2019-09-21 DIAGNOSIS — M62838 Other muscle spasm: Secondary | ICD-10-CM | POA: Diagnosis not present

## 2019-11-20 DIAGNOSIS — R7303 Prediabetes: Secondary | ICD-10-CM | POA: Diagnosis not present

## 2019-11-20 DIAGNOSIS — F418 Other specified anxiety disorders: Secondary | ICD-10-CM | POA: Diagnosis not present

## 2019-11-20 DIAGNOSIS — E7800 Pure hypercholesterolemia, unspecified: Secondary | ICD-10-CM | POA: Diagnosis not present

## 2019-11-20 DIAGNOSIS — D72819 Decreased white blood cell count, unspecified: Secondary | ICD-10-CM | POA: Diagnosis not present

## 2019-12-27 DIAGNOSIS — Z0100 Encounter for examination of eyes and vision without abnormal findings: Secondary | ICD-10-CM | POA: Diagnosis not present

## 2019-12-27 DIAGNOSIS — Z135 Encounter for screening for eye and ear disorders: Secondary | ICD-10-CM | POA: Diagnosis not present

## 2019-12-27 DIAGNOSIS — E7800 Pure hypercholesterolemia, unspecified: Secondary | ICD-10-CM | POA: Diagnosis not present

## 2019-12-27 DIAGNOSIS — H5210 Myopia, unspecified eye: Secondary | ICD-10-CM | POA: Diagnosis not present

## 2020-04-21 DIAGNOSIS — D0511 Intraductal carcinoma in situ of right breast: Secondary | ICD-10-CM | POA: Diagnosis not present

## 2020-05-03 DIAGNOSIS — J329 Chronic sinusitis, unspecified: Secondary | ICD-10-CM | POA: Diagnosis not present

## 2020-05-03 DIAGNOSIS — B974 Respiratory syncytial virus as the cause of diseases classified elsewhere: Secondary | ICD-10-CM | POA: Diagnosis not present

## 2020-05-03 DIAGNOSIS — Z03818 Encounter for observation for suspected exposure to other biological agents ruled out: Secondary | ICD-10-CM | POA: Diagnosis not present

## 2020-05-03 DIAGNOSIS — R05 Cough: Secondary | ICD-10-CM | POA: Diagnosis not present

## 2020-05-13 ENCOUNTER — Other Ambulatory Visit: Payer: Self-pay | Admitting: General Surgery

## 2020-05-13 DIAGNOSIS — D0511 Intraductal carcinoma in situ of right breast: Secondary | ICD-10-CM

## 2020-05-29 DIAGNOSIS — E7800 Pure hypercholesterolemia, unspecified: Secondary | ICD-10-CM | POA: Diagnosis not present

## 2020-05-29 DIAGNOSIS — D72819 Decreased white blood cell count, unspecified: Secondary | ICD-10-CM | POA: Diagnosis not present

## 2020-05-29 DIAGNOSIS — R7303 Prediabetes: Secondary | ICD-10-CM | POA: Diagnosis not present

## 2020-05-29 DIAGNOSIS — Z853 Personal history of malignant neoplasm of breast: Secondary | ICD-10-CM | POA: Diagnosis not present

## 2020-05-30 ENCOUNTER — Ambulatory Visit
Admission: RE | Admit: 2020-05-30 | Discharge: 2020-05-30 | Disposition: A | Discharge status: Home / Self Care | Payer: Medicare HMO | Source: Ambulatory Visit | Attending: General Surgery | Admitting: General Surgery

## 2020-05-30 ENCOUNTER — Other Ambulatory Visit: Payer: Self-pay

## 2020-05-30 DIAGNOSIS — D0511 Intraductal carcinoma in situ of right breast: Secondary | ICD-10-CM

## 2020-05-30 DIAGNOSIS — R922 Inconclusive mammogram: Secondary | ICD-10-CM | POA: Diagnosis not present

## 2020-05-30 HISTORY — DX: Personal history of irradiation: Z92.3

## 2020-07-02 ENCOUNTER — Other Ambulatory Visit: Payer: Self-pay

## 2020-07-02 ENCOUNTER — Ambulatory Visit (INDEPENDENT_AMBULATORY_CARE_PROVIDER_SITE_OTHER): Payer: Medicare HMO

## 2020-07-02 ENCOUNTER — Encounter: Payer: Self-pay | Admitting: Podiatry

## 2020-07-02 ENCOUNTER — Ambulatory Visit: Payer: Medicare HMO | Admitting: Podiatry

## 2020-07-02 DIAGNOSIS — M79671 Pain in right foot: Secondary | ICD-10-CM

## 2020-07-02 DIAGNOSIS — M779 Enthesopathy, unspecified: Secondary | ICD-10-CM

## 2020-07-02 DIAGNOSIS — M25571 Pain in right ankle and joints of right foot: Secondary | ICD-10-CM

## 2020-07-02 NOTE — Progress Notes (Signed)
Subjective:   Patient ID: Caroline Wilson, female   DOB: 75 y.o.   MRN: [id number]   HPI Patient presents stating that she is getting a lot of pain in her right ankle and it seems like it is radiating to her right lower leg.  States it is been sore and making it hard to walk neuro   ROS      Objective:  Physical Exam  Vascular status intact with exquisite discomfort noted virtually rather charged her cherry red with inflammation fluid     Assessment:  Sinus tarsitis right with inflammation fluid     Plan:  Sterile prep injected the sinus tarsi right 3 mg Kenalog 5 mg Xylocaine advised on reduced activity reappoint if symptoms were to persist  X-rays show no indications of stress fracture or advanced arthritis deformity

## 2020-07-03 ENCOUNTER — Telehealth: Payer: Self-pay | Admitting: Podiatry

## 2020-07-03 NOTE — Telephone Encounter (Signed)
Pt called stating her pharmacy never received her Rx. Medication unspecified. Please advise.

## 2020-07-04 ENCOUNTER — Other Ambulatory Visit: Payer: Self-pay | Admitting: Podiatry

## 2020-07-04 MED ORDER — DICLOFENAC SODIUM 75 MG PO TBEC: 75.0000 mg | DELAYED_RELEASE_TABLET | Freq: Two times a day (BID) | ORAL | 2 refills | Status: AC

## 2020-07-04 NOTE — Telephone Encounter (Signed)
Sent in her prescription

## 2020-07-04 NOTE — Progress Notes (Unsigned)
vol

## 2020-07-08 ENCOUNTER — Other Ambulatory Visit: Payer: Self-pay | Admitting: Podiatry

## 2020-07-08 DIAGNOSIS — M25571 Pain in right ankle and joints of right foot: Secondary | ICD-10-CM

## 2020-09-17 DIAGNOSIS — M7061 Trochanteric bursitis, right hip: Secondary | ICD-10-CM | POA: Diagnosis not present

## 2020-10-16 DIAGNOSIS — M7061 Trochanteric bursitis, right hip: Secondary | ICD-10-CM | POA: Diagnosis not present

## 2020-10-16 DIAGNOSIS — M5136 Other intervertebral disc degeneration, lumbar region: Secondary | ICD-10-CM | POA: Diagnosis not present

## 2020-10-21 DIAGNOSIS — D0511 Intraductal carcinoma in situ of right breast: Secondary | ICD-10-CM | POA: Diagnosis not present

## 2020-12-10 DIAGNOSIS — Z86000 Personal history of in-situ neoplasm of breast: Secondary | ICD-10-CM | POA: Diagnosis not present

## 2020-12-10 DIAGNOSIS — D72819 Decreased white blood cell count, unspecified: Secondary | ICD-10-CM | POA: Diagnosis not present

## 2020-12-10 DIAGNOSIS — E7800 Pure hypercholesterolemia, unspecified: Secondary | ICD-10-CM | POA: Diagnosis not present

## 2021-04-29 ENCOUNTER — Other Ambulatory Visit: Payer: Self-pay | Admitting: Family Medicine

## 2021-04-29 DIAGNOSIS — R928 Other abnormal and inconclusive findings on diagnostic imaging of breast: Secondary | ICD-10-CM

## 2021-05-15 DIAGNOSIS — Z90710 Acquired absence of both cervix and uterus: Secondary | ICD-10-CM | POA: Diagnosis not present

## 2021-05-15 DIAGNOSIS — Z01411 Encounter for gynecological examination (general) (routine) with abnormal findings: Secondary | ICD-10-CM | POA: Diagnosis not present

## 2021-05-15 DIAGNOSIS — Z6832 Body mass index (BMI) 32.0-32.9, adult: Secondary | ICD-10-CM | POA: Diagnosis not present

## 2021-05-15 DIAGNOSIS — Z853 Personal history of malignant neoplasm of breast: Secondary | ICD-10-CM | POA: Diagnosis not present

## 2021-05-15 DIAGNOSIS — Z124 Encounter for screening for malignant neoplasm of cervix: Secondary | ICD-10-CM | POA: Diagnosis not present

## 2021-05-15 DIAGNOSIS — M8580 Other specified disorders of bone density and structure, unspecified site: Secondary | ICD-10-CM | POA: Diagnosis not present

## 2021-05-15 DIAGNOSIS — Z01419 Encounter for gynecological examination (general) (routine) without abnormal findings: Secondary | ICD-10-CM | POA: Diagnosis not present

## 2021-05-15 DIAGNOSIS — N951 Menopausal and female climacteric states: Secondary | ICD-10-CM | POA: Diagnosis not present

## 2021-05-18 ENCOUNTER — Ambulatory Visit: Payer: Medicare HMO

## 2021-05-18 ENCOUNTER — Ambulatory Visit: Payer: Medicare HMO | Attending: Internal Medicine

## 2021-05-18 ENCOUNTER — Other Ambulatory Visit: Payer: Self-pay | Admitting: Obstetrics & Gynecology

## 2021-05-18 ENCOUNTER — Other Ambulatory Visit: Payer: Self-pay | Admitting: Family Medicine

## 2021-05-18 DIAGNOSIS — M8580 Other specified disorders of bone density and structure, unspecified site: Secondary | ICD-10-CM

## 2021-05-18 DIAGNOSIS — Z23 Encounter for immunization: Secondary | ICD-10-CM

## 2021-05-18 NOTE — Progress Notes (Signed)
   Covid-19 Vaccination Clinic  Name:  Herma Uballe    MRN: [id number] DOB: 08/06/1946  05/18/2021  Caroline Wilson was observed post Covid-19 immunization for 15 minutes without incident. She was provided with Vaccine Information Sheet and instruction to access the V-Safe system.   Ms. Tangredi was instructed to call 911 with any severe reactions post vaccine: Difficulty breathing  Swelling of face and throat  A fast heartbeat  A bad rash all over body  Dizziness and weakness

## 2021-05-25 ENCOUNTER — Other Ambulatory Visit (HOSPITAL_BASED_OUTPATIENT_CLINIC_OR_DEPARTMENT_OTHER): Payer: Self-pay

## 2021-05-25 DIAGNOSIS — Z6832 Body mass index (BMI) 32.0-32.9, adult: Secondary | ICD-10-CM | POA: Diagnosis not present

## 2021-05-25 DIAGNOSIS — E7800 Pure hypercholesterolemia, unspecified: Secondary | ICD-10-CM | POA: Diagnosis not present

## 2021-05-25 DIAGNOSIS — R7303 Prediabetes: Secondary | ICD-10-CM | POA: Diagnosis not present

## 2021-05-25 DIAGNOSIS — E669 Obesity, unspecified: Secondary | ICD-10-CM | POA: Diagnosis not present

## 2021-05-25 DIAGNOSIS — D72819 Decreased white blood cell count, unspecified: Secondary | ICD-10-CM | POA: Diagnosis not present

## 2021-05-25 DIAGNOSIS — Z23 Encounter for immunization: Secondary | ICD-10-CM | POA: Diagnosis not present

## 2021-05-25 MED ORDER — COVID-19MRNA BIVAL VACC PFIZER 30 MCG/0.3ML IM SUSP
INTRAMUSCULAR | 0 refills | Status: AC
  Filled 2021-05-25: qty 0.3, 1d supply, fill #0

## 2021-06-02 ENCOUNTER — Other Ambulatory Visit: Payer: Self-pay | Admitting: Obstetrics & Gynecology

## 2021-06-02 DIAGNOSIS — R928 Other abnormal and inconclusive findings on diagnostic imaging of breast: Secondary | ICD-10-CM

## 2021-06-04 DIAGNOSIS — H5210 Myopia, unspecified eye: Secondary | ICD-10-CM | POA: Diagnosis not present

## 2021-06-04 DIAGNOSIS — E7800 Pure hypercholesterolemia, unspecified: Secondary | ICD-10-CM | POA: Diagnosis not present

## 2021-06-05 ENCOUNTER — Ambulatory Visit
Admission: RE | Admit: 2021-06-05 | Discharge: 2021-06-05 | Disposition: A | Discharge status: Home / Self Care | Payer: Medicare HMO | Source: Ambulatory Visit | Attending: Family Medicine | Admitting: Family Medicine

## 2021-06-05 ENCOUNTER — Other Ambulatory Visit: Payer: Self-pay

## 2021-06-05 DIAGNOSIS — R922 Inconclusive mammogram: Secondary | ICD-10-CM | POA: Diagnosis not present

## 2021-06-05 DIAGNOSIS — R928 Other abnormal and inconclusive findings on diagnostic imaging of breast: Secondary | ICD-10-CM

## 2021-06-09 DIAGNOSIS — D0511 Intraductal carcinoma in situ of right breast: Secondary | ICD-10-CM | POA: Diagnosis not present

## 2021-07-08 DIAGNOSIS — R159 Full incontinence of feces: Secondary | ICD-10-CM | POA: Diagnosis not present

## 2021-08-07 DIAGNOSIS — H524 Presbyopia: Secondary | ICD-10-CM | POA: Diagnosis not present

## 2021-08-07 DIAGNOSIS — H5213 Myopia, bilateral: Secondary | ICD-10-CM | POA: Diagnosis not present

## 2021-08-07 DIAGNOSIS — H52209 Unspecified astigmatism, unspecified eye: Secondary | ICD-10-CM | POA: Diagnosis not present

## 2021-09-22 DIAGNOSIS — M62838 Other muscle spasm: Secondary | ICD-10-CM | POA: Diagnosis not present

## 2021-10-02 DIAGNOSIS — R159 Full incontinence of feces: Secondary | ICD-10-CM | POA: Diagnosis not present

## 2021-10-02 DIAGNOSIS — M6289 Other specified disorders of muscle: Secondary | ICD-10-CM | POA: Diagnosis not present

## 2021-10-02 DIAGNOSIS — K6289 Other specified diseases of anus and rectum: Secondary | ICD-10-CM | POA: Diagnosis not present

## 2021-11-04 ENCOUNTER — Ambulatory Visit
Admission: RE | Admit: 2021-11-04 | Discharge: 2021-11-04 | Disposition: A | Discharge status: Home / Self Care | Payer: Medicare HMO | Source: Ambulatory Visit | Attending: Obstetrics & Gynecology | Admitting: Obstetrics & Gynecology

## 2021-11-04 DIAGNOSIS — Z780 Asymptomatic menopausal state: Secondary | ICD-10-CM | POA: Diagnosis not present

## 2021-11-04 DIAGNOSIS — M8580 Other specified disorders of bone density and structure, unspecified site: Secondary | ICD-10-CM

## 2021-11-04 DIAGNOSIS — M8588 Other specified disorders of bone density and structure, other site: Secondary | ICD-10-CM | POA: Diagnosis not present

## 2021-11-24 DIAGNOSIS — E7800 Pure hypercholesterolemia, unspecified: Secondary | ICD-10-CM | POA: Diagnosis not present

## 2021-11-24 DIAGNOSIS — R7303 Prediabetes: Secondary | ICD-10-CM | POA: Diagnosis not present

## 2021-11-24 DIAGNOSIS — M6281 Muscle weakness (generalized): Secondary | ICD-10-CM | POA: Diagnosis not present

## 2021-11-24 DIAGNOSIS — R151 Fecal smearing: Secondary | ICD-10-CM | POA: Diagnosis not present

## 2021-11-24 DIAGNOSIS — R198 Other specified symptoms and signs involving the digestive system and abdomen: Secondary | ICD-10-CM | POA: Diagnosis not present

## 2021-11-24 DIAGNOSIS — M8580 Other specified disorders of bone density and structure, unspecified site: Secondary | ICD-10-CM | POA: Diagnosis not present

## 2021-11-24 DIAGNOSIS — M6289 Other specified disorders of muscle: Secondary | ICD-10-CM | POA: Diagnosis not present

## 2021-11-24 DIAGNOSIS — D72819 Decreased white blood cell count, unspecified: Secondary | ICD-10-CM | POA: Diagnosis not present

## 2021-12-08 DIAGNOSIS — R151 Fecal smearing: Secondary | ICD-10-CM | POA: Diagnosis not present

## 2021-12-08 DIAGNOSIS — K5902 Outlet dysfunction constipation: Secondary | ICD-10-CM | POA: Diagnosis not present

## 2021-12-08 DIAGNOSIS — M6289 Other specified disorders of muscle: Secondary | ICD-10-CM | POA: Diagnosis not present

## 2021-12-08 DIAGNOSIS — M6281 Muscle weakness (generalized): Secondary | ICD-10-CM | POA: Diagnosis not present

## 2021-12-08 DIAGNOSIS — M62838 Other muscle spasm: Secondary | ICD-10-CM | POA: Diagnosis not present

## 2021-12-17 DIAGNOSIS — M62838 Other muscle spasm: Secondary | ICD-10-CM | POA: Diagnosis not present

## 2021-12-17 DIAGNOSIS — M6289 Other specified disorders of muscle: Secondary | ICD-10-CM | POA: Diagnosis not present

## 2021-12-17 DIAGNOSIS — M6281 Muscle weakness (generalized): Secondary | ICD-10-CM | POA: Diagnosis not present

## 2021-12-17 DIAGNOSIS — R151 Fecal smearing: Secondary | ICD-10-CM | POA: Diagnosis not present

## 2021-12-17 DIAGNOSIS — K5902 Outlet dysfunction constipation: Secondary | ICD-10-CM | POA: Diagnosis not present

## 2021-12-24 DIAGNOSIS — K5902 Outlet dysfunction constipation: Secondary | ICD-10-CM | POA: Diagnosis not present

## 2021-12-24 DIAGNOSIS — M62838 Other muscle spasm: Secondary | ICD-10-CM | POA: Diagnosis not present

## 2021-12-24 DIAGNOSIS — R151 Fecal smearing: Secondary | ICD-10-CM | POA: Diagnosis not present

## 2021-12-24 DIAGNOSIS — M6281 Muscle weakness (generalized): Secondary | ICD-10-CM | POA: Diagnosis not present

## 2021-12-24 DIAGNOSIS — M6289 Other specified disorders of muscle: Secondary | ICD-10-CM | POA: Diagnosis not present

## 2022-01-21 DIAGNOSIS — M6289 Other specified disorders of muscle: Secondary | ICD-10-CM | POA: Diagnosis not present

## 2022-01-21 DIAGNOSIS — K5902 Outlet dysfunction constipation: Secondary | ICD-10-CM | POA: Diagnosis not present

## 2022-01-21 DIAGNOSIS — M6281 Muscle weakness (generalized): Secondary | ICD-10-CM | POA: Diagnosis not present

## 2022-01-21 DIAGNOSIS — R151 Fecal smearing: Secondary | ICD-10-CM | POA: Diagnosis not present

## 2022-01-21 DIAGNOSIS — M62838 Other muscle spasm: Secondary | ICD-10-CM | POA: Diagnosis not present

## 2022-02-25 DIAGNOSIS — M6289 Other specified disorders of muscle: Secondary | ICD-10-CM | POA: Diagnosis not present

## 2022-02-25 DIAGNOSIS — R151 Fecal smearing: Secondary | ICD-10-CM | POA: Diagnosis not present

## 2022-02-25 DIAGNOSIS — K5902 Outlet dysfunction constipation: Secondary | ICD-10-CM | POA: Diagnosis not present

## 2022-02-25 DIAGNOSIS — M6281 Muscle weakness (generalized): Secondary | ICD-10-CM | POA: Diagnosis not present

## 2022-02-25 DIAGNOSIS — M62838 Other muscle spasm: Secondary | ICD-10-CM | POA: Diagnosis not present

## 2022-04-23 ENCOUNTER — Other Ambulatory Visit: Payer: Self-pay | Admitting: Obstetrics & Gynecology

## 2022-04-23 DIAGNOSIS — Z9889 Other specified postprocedural states: Secondary | ICD-10-CM

## 2022-04-28 DIAGNOSIS — M5459 Other low back pain: Secondary | ICD-10-CM | POA: Diagnosis not present

## 2022-04-28 DIAGNOSIS — M545 Low back pain, unspecified: Secondary | ICD-10-CM | POA: Diagnosis not present

## 2022-05-18 DIAGNOSIS — D0511 Intraductal carcinoma in situ of right breast: Secondary | ICD-10-CM | POA: Diagnosis not present

## 2022-06-07 ENCOUNTER — Ambulatory Visit
Admission: RE | Admit: 2022-06-07 | Discharge: 2022-06-07 | Disposition: A | Discharge status: Home / Self Care | Payer: Medicare HMO | Source: Ambulatory Visit | Attending: Obstetrics & Gynecology | Admitting: Obstetrics & Gynecology

## 2022-06-07 DIAGNOSIS — Z9889 Other specified postprocedural states: Secondary | ICD-10-CM

## 2022-06-07 DIAGNOSIS — Z853 Personal history of malignant neoplasm of breast: Secondary | ICD-10-CM | POA: Diagnosis not present

## 2022-06-07 DIAGNOSIS — R92333 Mammographic heterogeneous density, bilateral breasts: Secondary | ICD-10-CM | POA: Diagnosis not present

## 2022-06-12 DIAGNOSIS — S83411A Sprain of medial collateral ligament of right knee, initial encounter: Secondary | ICD-10-CM | POA: Diagnosis not present

## 2022-06-17 ENCOUNTER — Encounter (HOSPITAL_BASED_OUTPATIENT_CLINIC_OR_DEPARTMENT_OTHER): Payer: Self-pay

## 2022-06-17 ENCOUNTER — Other Ambulatory Visit: Payer: Self-pay

## 2022-06-17 ENCOUNTER — Emergency Department (HOSPITAL_BASED_OUTPATIENT_CLINIC_OR_DEPARTMENT_OTHER)
Admission: EM | Admit: 2022-06-17 | Discharge: 2022-06-17 | Disposition: A | Discharge status: Home / Self Care | Payer: Medicare HMO | Attending: Emergency Medicine | Admitting: Emergency Medicine

## 2022-06-17 DIAGNOSIS — R1013 Epigastric pain: Secondary | ICD-10-CM | POA: Diagnosis not present

## 2022-06-17 DIAGNOSIS — Z853 Personal history of malignant neoplasm of breast: Secondary | ICD-10-CM | POA: Insufficient documentation

## 2022-06-17 LAB — URINALYSIS, ROUTINE W REFLEX MICROSCOPIC
Bilirubin Urine: NEGATIVE
Glucose, UA: NEGATIVE mg/dL
Hgb urine dipstick: NEGATIVE
Ketones, ur: NEGATIVE mg/dL
Leukocytes,Ua: NEGATIVE
Nitrite: NEGATIVE
Protein, ur: NEGATIVE mg/dL
Specific Gravity, Urine: 1.015 (ref 1.005–1.030)
pH: 5.5 (ref 5.0–8.0)

## 2022-06-17 LAB — COMPREHENSIVE METABOLIC PANEL
ALT: 18 U/L (ref 0–44)
AST: 24 U/L (ref 15–41)
Albumin: 4.3 g/dL (ref 3.5–5.0)
Alkaline Phosphatase: 46 U/L (ref 38–126)
Anion gap: 6 (ref 5–15)
BUN: 16 mg/dL (ref 8–23)
CO2: 26 mmol/L (ref 22–32)
Calcium: 9.7 mg/dL (ref 8.9–10.3)
Chloride: 105 mmol/L (ref 98–111)
Creatinine, Ser: 0.84 mg/dL (ref 0.44–1.00)
GFR, Estimated: >60 mL/min (ref >60)
Glucose, Bld: 81 mg/dL (ref 70–99)
Potassium: 3.7 mmol/L (ref 3.5–5.1)
Sodium: 137 mmol/L (ref 135–145)
Total Bilirubin: 0.4 mg/dL (ref 0.3–1.2)
Total Protein: 7.8 g/dL (ref 6.5–8.1)

## 2022-06-17 LAB — CBC
HCT: 37.6 % (ref 36.0–46.0)
Hemoglobin: 12 g/dL (ref 12.0–15.0)
MCH: 28.4 pg (ref 26.0–34.0)
MCHC: 31.9 g/dL (ref 30.0–36.0)
MCV: 88.9 fL (ref 80.0–100.0)
Platelets: 215 10*3/uL (ref 150–400)
RBC: 4.23 MIL/uL (ref 3.87–5.11)
RDW: 13.7 % (ref 11.5–15.5)
WBC: 3.8 10*3/uL — ABNORMAL LOW (ref 4.0–10.5)
nRBC: 0 % (ref 0.0–0.2)

## 2022-06-17 LAB — LIPASE, BLOOD: Lipase: 18 U/L (ref 11–51)

## 2022-06-17 MED ORDER — FAMOTIDINE IN NACL 20-0.9 MG/50ML-% IV SOLN
20.0000 mg | Freq: Once | INTRAVENOUS | Status: AC
  Administered 2022-06-17: 20 mg via INTRAVENOUS
  Filled 2022-06-17: qty 50

## 2022-06-17 MED ORDER — ALUM & MAG HYDROXIDE-SIMETH 200-200-20 MG/5ML PO SUSP
15.0000 mL | Freq: Once | ORAL | Status: DC
  Filled 2022-06-17: qty 30

## 2022-06-17 MED ORDER — LIDOCAINE VISCOUS HCL 2 % MT SOLN
15.0000 mL | Freq: Once | OROMUCOSAL | Status: DC
  Filled 2022-06-17: qty 15

## 2022-06-17 MED ORDER — PANTOPRAZOLE SODIUM 40 MG IV SOLR
40.0000 mg | Freq: Once | INTRAVENOUS | Status: AC
  Administered 2022-06-17: 40 mg via INTRAVENOUS
  Filled 2022-06-17: qty 10

## 2022-06-17 NOTE — ED Provider Notes (Signed)
[facility] EMERGENCY DEPARTMENT Provider Note   CSN: [id number] Arrival date & time: 06/17/22  1745     History  Chief Complaint  Patient presents with   Abdominal Pain    Caroline Wilson is a 76 y.o. female.  Patient presents to the hospital complaining of epigastric pain which radiates to her right shoulder.  She states this pain is intermittent.  She states it began on Monday and feels like hunger pains.  She denies nausea, vomiting, diarrhea, shortness of breath, chest pain, dysuria.  Patient with past medical history sniffing for arthritis, seasonal allergies, high cholesterol, breast cancer, atypical chest pain  HPI     Home Medications Prior to Admission medications   Medication Sig Start Date End Date Taking? Authorizing Provider  amoxicillin-clavulanate (AUGMENTIN) 875-125 MG tablet  05/03/20   [provider]  cholecalciferol (VITAMIN D) 1000 units tablet Take 1,000 Units by mouth daily.    [provider]  COVID-19 mRNA bivalent vaccine, Pfizer, injection Inject into the muscle. 05/18/21   Carlyle Basques, MD  diclofenac (VOLTAREN) 75 MG EC tablet Take 1 tablet (75 mg total) by mouth 2 (two) times daily. 07/04/20   Wallene Huh, DPM  Multiple Vitamin (MULTIVITAMIN) tablet Take 1 tablet by mouth daily.    [provider]  naproxen (NAPROSYN) 500 MG tablet Take 1 tablet (500 mg total) by mouth every 6 (six) hours as needed. 07/06/16   Marcial Pacas, MD  naproxen sodium (ALEVE) 220 MG tablet Take 220 mg by mouth daily as needed.    [provider]  PARoxetine (PAXIL) 10 MG tablet Take 10 mg by mouth daily. 08/14/18   [provider]  psyllium (METAMUCIL) 58.6 % packet Take 1 packet by mouth daily.    [provider]  rosuvastatin (CRESTOR) 5 MG tablet Take 5 mg by mouth daily at 6 PM.    [provider]  tiZANidine (ZANAFLEX) 2 MG tablet TAKE 1 TABLET BY MOUTH EVERY 8 HOURS AS NEEDED FOR MUSCLE SPASM  12/27/18   [provider]  Turmeric (QC TUMERIC COMPLEX PO) Take by mouth.    [provider]      Allergies    Patient has no known allergies.    Review of Systems   Review of Systems  Respiratory:  Negative for shortness of breath.   Gastrointestinal:  Positive for abdominal pain. Negative for constipation, diarrhea, nausea and vomiting.  Genitourinary:  Negative for dysuria.    Physical Exam Updated Vital Signs BP (!) 147/76   Pulse (!) 57   Temp 98.3 F (36.8 C) (Oral)   Resp 14   Ht '5\' 2"'$  (1.575 m)   Wt 81.6 kg   SpO2 99%   BMI 32.92 kg/m  Physical Exam Vitals and nursing note reviewed.  Constitutional:      General: She is not in acute distress.    Appearance: She is well-developed.  HENT:     Head: Normocephalic and atraumatic.  Eyes:     Extraocular Movements: Extraocular movements intact.     Conjunctiva/sclera: Conjunctivae normal.  Cardiovascular:     Rate and Rhythm: Normal rate and regular rhythm.     Heart sounds: No murmur heard. Pulmonary:     Effort: Pulmonary effort is normal. No respiratory distress.     Breath sounds: Normal breath sounds.  Abdominal:     General: Abdomen is flat. Bowel sounds are normal.     Palpations: Abdomen is soft.  Tenderness: There is no abdominal tenderness.  Musculoskeletal:        General: No swelling.     Cervical back: Neck supple.  Skin:    General: Skin is warm and dry.     Capillary Refill: Capillary refill takes less than 2 seconds.  Neurological:     Mental Status: She is alert.  Psychiatric:        Mood and Affect: Mood normal.     ED Results / Procedures / Treatments   Labs (all labs ordered are listed, but only abnormal results are displayed) Labs Reviewed  CBC - Abnormal; Notable for the following components:      Result Value   WBC 3.8 (*)    All other components within normal limits  LIPASE, BLOOD  COMPREHENSIVE METABOLIC PANEL  URINALYSIS, ROUTINE W REFLEX MICROSCOPIC     EKG EKG Interpretation  Date/Time:  Thursday June 17 2022 18:14:02 EDT Ventricular Rate:  61 PR Interval:  145 QRS Duration: 87 QT Interval:  386 QTC Calculation: 389 R Axis:   53 Text Interpretation: Sinus rhythm when comapred to prior, similar to prior NO STEMI Confirmed by Antony Blackbird [phone]) on 06/17/2022 6:40:45 PM  Radiology No results found.  Procedures Procedures    Medications Ordered in ED Medications  famotidine (PEPCID) IVPB 20 mg premix (0 mg Intravenous Stopped 06/17/22 2202)  pantoprazole (PROTONIX) injection 40 mg (40 mg Intravenous Given 06/17/22 2127)    ED Course/ Medical Decision Making/ A&P                           Medical Decision Making Amount and/or Complexity of Data Reviewed Labs: ordered.  Risk Prescription drug management.   This patient presents to the ED for concern of epigastric pain, this involves an extensive number of treatment options, and is a complaint that carries with it a high risk of complications and morbidity.  The differential diagnosis includes pancreatitis, GERD, gastritis, cholecystitis, and others   Co morbidities that complicate the patient evaluation  History of atypical chest pain   Additional history obtained:  Additional history obtained from family at bedside External records from outside source obtained and reviewed including notes showing previous visit with Dr. Marlou Starks for follow-up for ductal carcinoma   Lab Tests:  I Ordered, and personally interpreted labs.  The pertinent results include: WBC 3.8, lipase 18, unremarkable CMP, unremarkable UA   Imaging Studies ordered:  The patient has no significant tenderness on exam.  I see no benefit at this time for imaging   Cardiac Monitoring: / EKG:  The patient was maintained on a cardiac monitor.  I personally viewed and interpreted the cardiac monitored which showed an underlying rhythm of: Sinus rhythm   Problem List / ED Course / Critical  interventions / Medication management   I ordered medication including pantoprazole and famotidine for possible reflux-like symptoms Reevaluation of the patient after these medicines showed that the patient improved I have reviewed the patients home medicines and have made adjustments as needed   Test / Admission - Considered:  The patient improved significantly with IV Pepcid and pantoprazole.  No signs of pancreatitis normal lipase.  No significant tenderness to suggest cholecystitis, appendicitis, diverticulitis.  I believe this is likely reflux/GERD.  There is no indication at this time for admission.  Plan to discharge home.  Patient may take over-the-counter medication such as Pepcid AC for symptom control this time.  Patient plans to follow-up  with primary care provider        Final Clinical Impression(s) / ED Diagnoses Final diagnoses:  Epigastric pain    Rx / DC Orders ED Discharge Orders     None         Ronny Bacon 06/17/22 2327    Tegeler, Gwenyth Allegra, MD 06/17/22 [phone]

## 2022-06-17 NOTE — Discharge Instructions (Addendum)
You were seen today for abdominal pain.  Your lab work was reassuring for no signs of pancreatitis at this time.  Your pain improved significantly with antacid medication.  I recommend trying Pepcid or Pepcid AC for symptom control.  Please follow-up with your primary care provider.  If you develop any life-threatening conditions please return to the emergency department

## 2022-06-17 NOTE — ED Notes (Signed)
D/c paperwork reviewed with pt, including f/u care. Pt c/o increasing abdominal pain 5/10, requesting something for pain. EDP Fritz Pickerel made aware, verbal order for maalox/mylanta and viscous lidocaine.

## 2022-06-17 NOTE — ED Notes (Signed)
RN returned to room to medicate pt prior to d/c. Pt no longer in room, no longer visualized in ED.

## 2022-06-17 NOTE — ED Triage Notes (Signed)
Complains of epigastric pain that radiates to right shoulder.  Reports its intermittent. Describes as hunger pains.  Denies n/v

## 2022-06-28 DIAGNOSIS — Z135 Encounter for screening for eye and ear disorders: Secondary | ICD-10-CM | POA: Diagnosis not present

## 2022-06-28 DIAGNOSIS — H2513 Age-related nuclear cataract, bilateral: Secondary | ICD-10-CM | POA: Diagnosis not present

## 2022-06-28 DIAGNOSIS — H52223 Regular astigmatism, bilateral: Secondary | ICD-10-CM | POA: Diagnosis not present

## 2022-06-28 DIAGNOSIS — H524 Presbyopia: Secondary | ICD-10-CM | POA: Diagnosis not present

## 2022-06-28 DIAGNOSIS — H5213 Myopia, bilateral: Secondary | ICD-10-CM | POA: Diagnosis not present

## 2022-07-05 ENCOUNTER — Other Ambulatory Visit (HOSPITAL_COMMUNITY): Payer: Self-pay

## 2022-07-05 DIAGNOSIS — Z6832 Body mass index (BMI) 32.0-32.9, adult: Secondary | ICD-10-CM | POA: Diagnosis not present

## 2022-07-05 DIAGNOSIS — R7309 Other abnormal glucose: Secondary | ICD-10-CM | POA: Diagnosis not present

## 2022-07-05 DIAGNOSIS — Z23 Encounter for immunization: Secondary | ICD-10-CM | POA: Diagnosis not present

## 2022-07-05 DIAGNOSIS — M25561 Pain in right knee: Secondary | ICD-10-CM | POA: Diagnosis not present

## 2022-07-05 DIAGNOSIS — E7800 Pure hypercholesterolemia, unspecified: Secondary | ICD-10-CM | POA: Diagnosis not present

## 2022-07-05 DIAGNOSIS — R7303 Prediabetes: Secondary | ICD-10-CM | POA: Diagnosis not present

## 2022-07-05 DIAGNOSIS — R159 Full incontinence of feces: Secondary | ICD-10-CM | POA: Diagnosis not present

## 2022-07-12 DIAGNOSIS — M25561 Pain in right knee: Secondary | ICD-10-CM | POA: Diagnosis not present

## 2022-07-15 IMAGING — MG DIGITAL DIAGNOSTIC BILAT W/ TOMO W/ CAD
6 of 9 series · 6 of 25 positions shown · non-contrast
Comparison: Previous exam(s).

CLINICAL DATA: RIGHT lumpectomy with radiation therapy in 4258.

EXAM:
DIGITAL DIAGNOSTIC BILATERAL MAMMOGRAM WITH TOMO AND CAD

[R MLO]
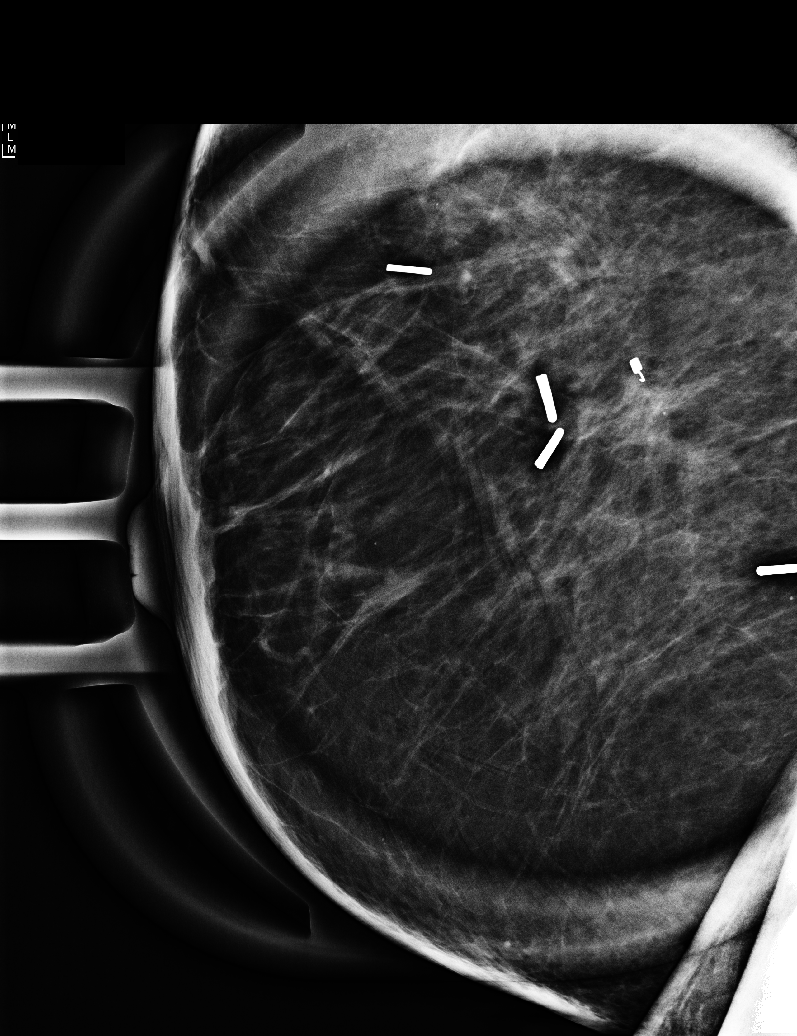

[L MLO synth-2D]
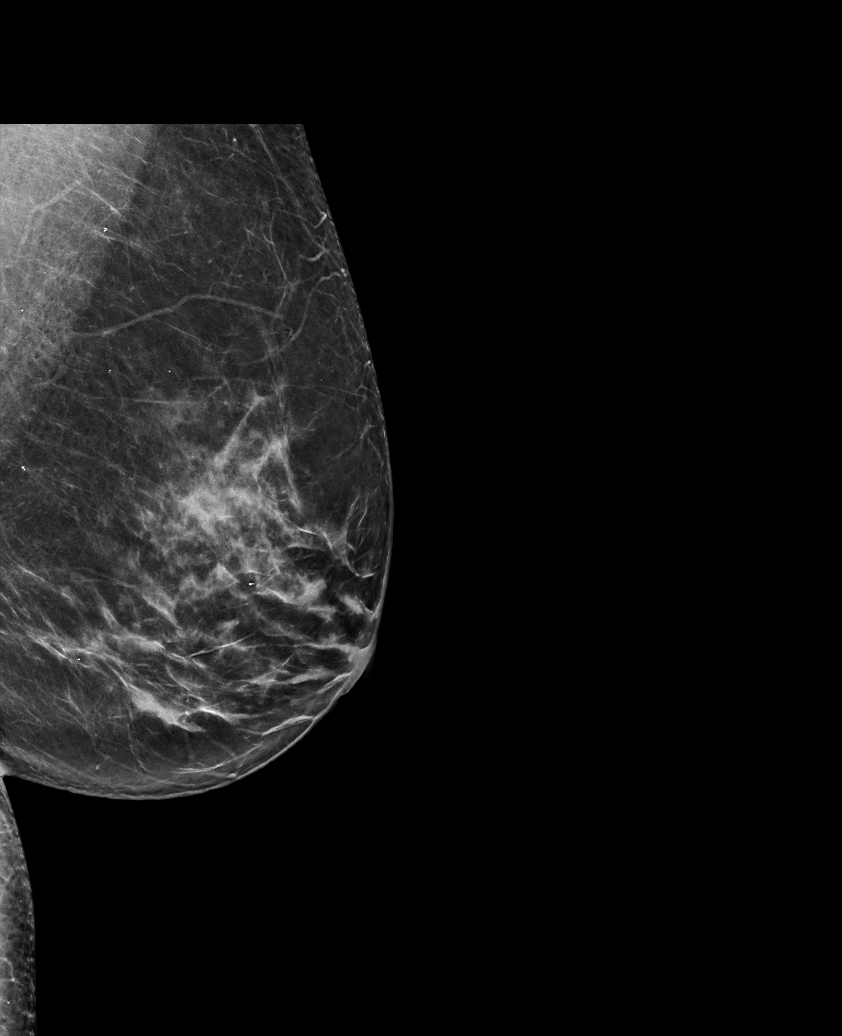

[R CC synth-2D]
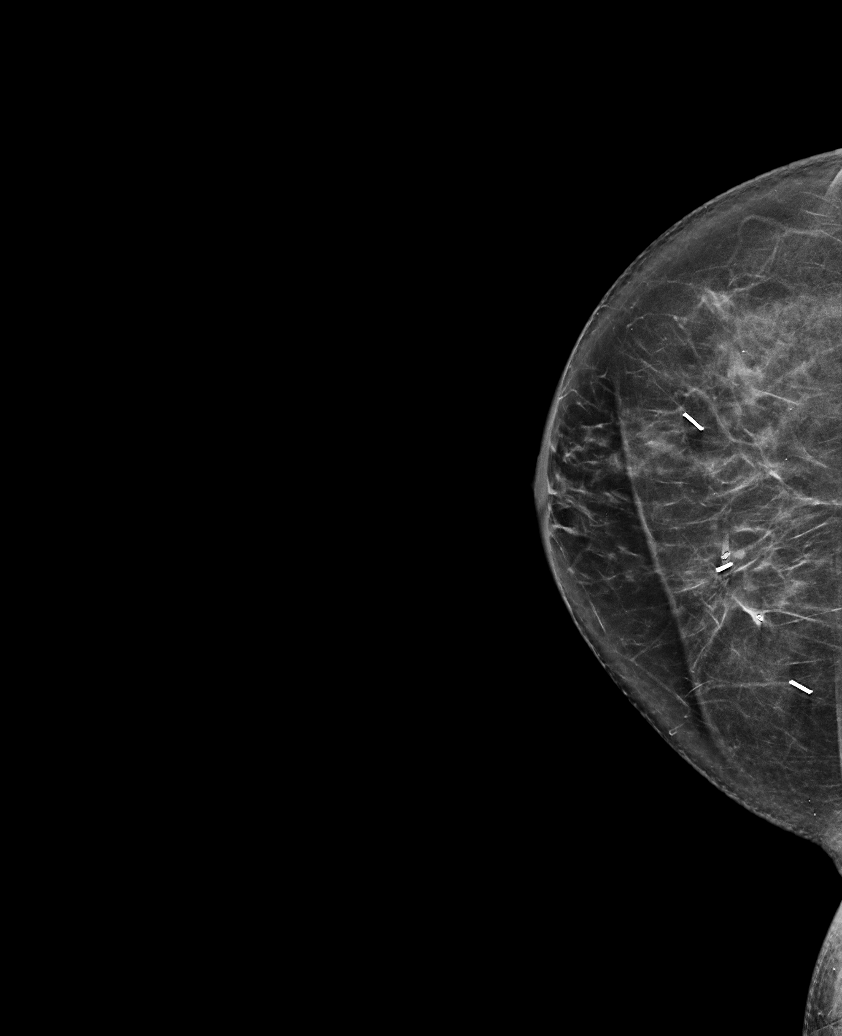

[L CC synth-2D]
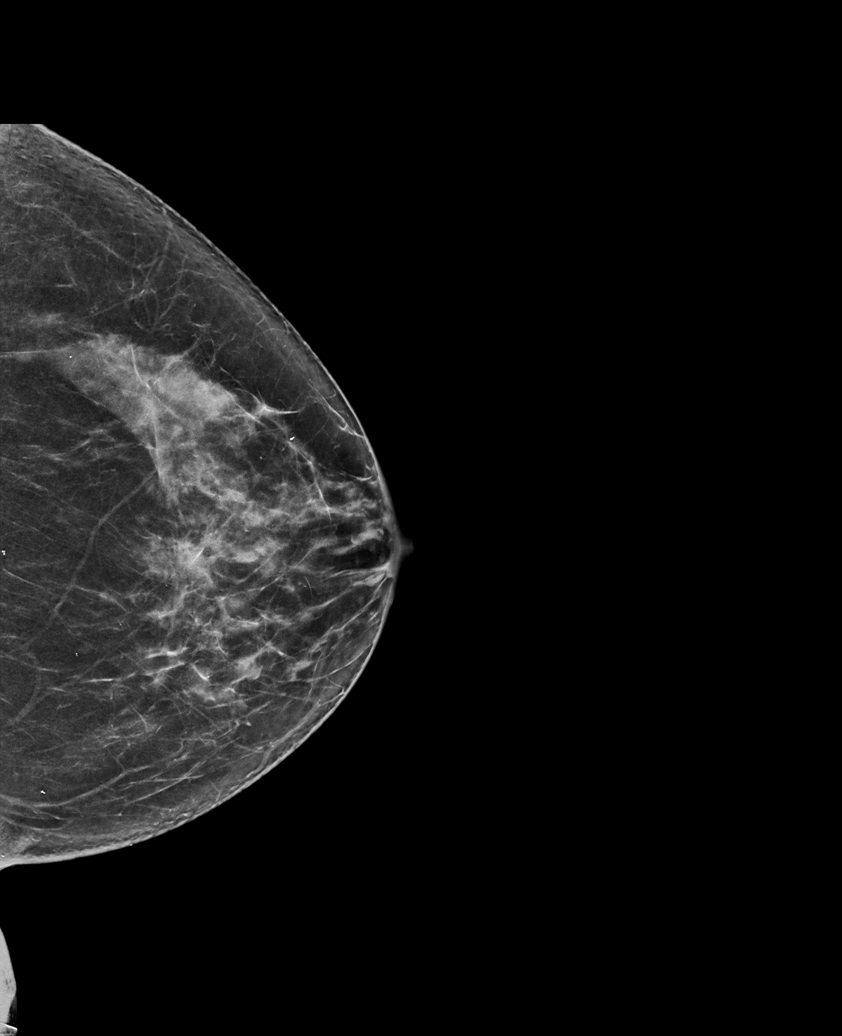

[R MLO synth-2D]
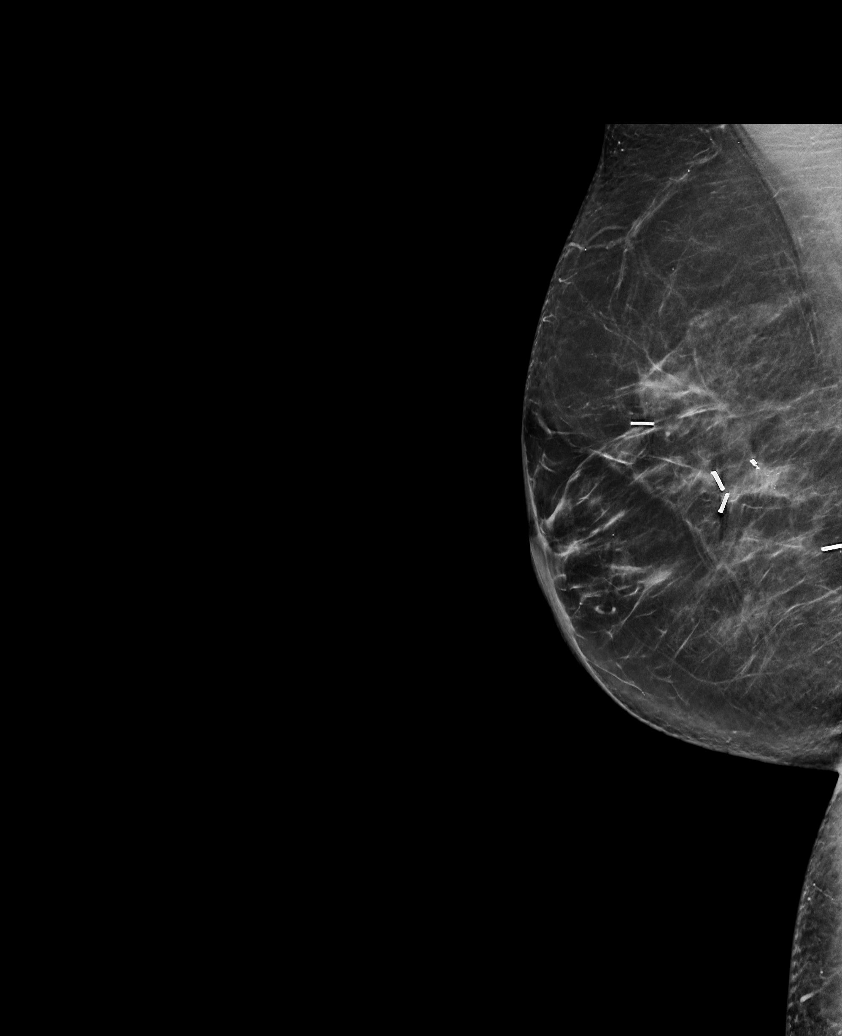

[R CC tomo · tomo slice 36/71.0]
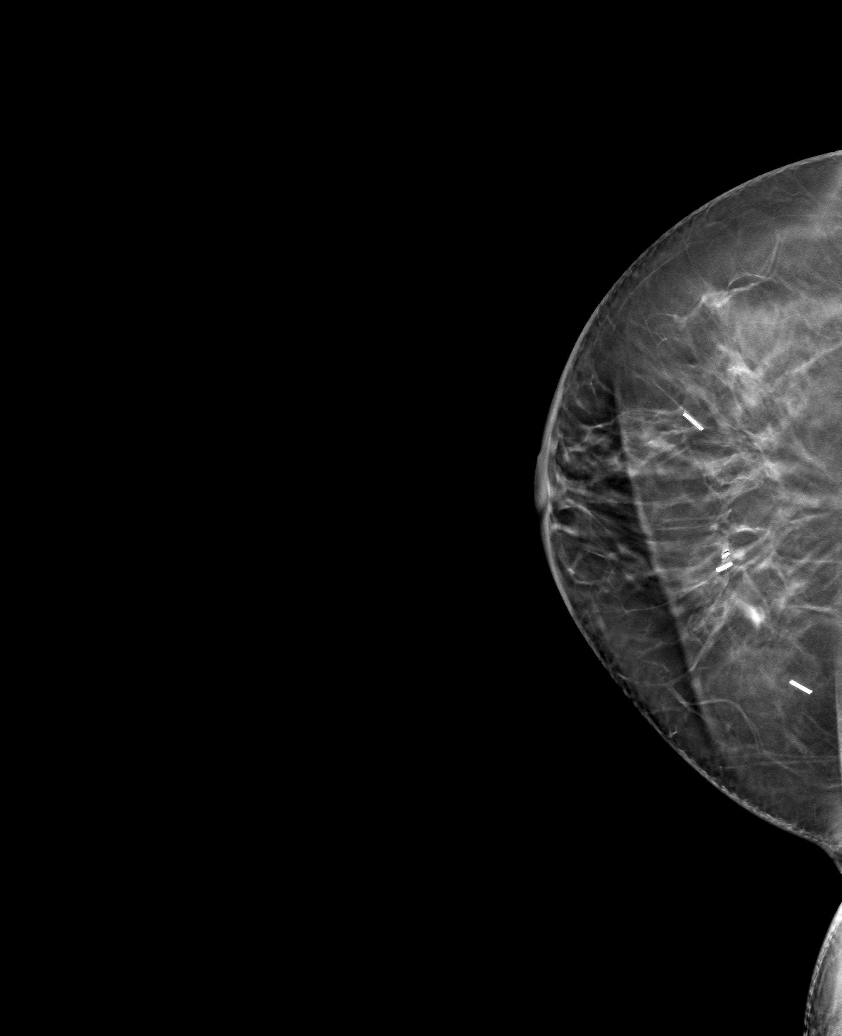

[6 of 25 positions shown; findings below may reference images not displayed]

ACR Breast Density Category c: The breast tissue is heterogeneously
dense, which may obscure small masses.
FINDINGS: Post operative changes are seen in the RIGHTbreast. No suspicious
mass, distortion, or microcalcifications are identified to suggest
presence of malignancy.

Mammographic images were processed with CAD.
IMPRESSION: No mammographic evidence for malignancy.

RECOMMENDATION:
Diagnostic mammogram is suggested in 1 year. (Code:AY-[id number])

I have discussed the findings and recommendations with the patient.
If applicable, a reminder letter will be sent to the patient
regarding the next appointment.

BI-RADS CATEGORY  2: Benign.

## 2022-09-27 DIAGNOSIS — B9789 Other viral agents as the cause of diseases classified elsewhere: Secondary | ICD-10-CM | POA: Diagnosis not present

## 2022-09-27 DIAGNOSIS — U071 COVID-19: Secondary | ICD-10-CM | POA: Diagnosis not present

## 2022-09-27 DIAGNOSIS — J988 Other specified respiratory disorders: Secondary | ICD-10-CM | POA: Diagnosis not present

## 2022-09-27 DIAGNOSIS — G4483 Primary cough headache: Secondary | ICD-10-CM | POA: Diagnosis not present

## 2022-12-23 DIAGNOSIS — M791 Myalgia, unspecified site: Secondary | ICD-10-CM | POA: Diagnosis not present

## 2022-12-23 DIAGNOSIS — M5459 Other low back pain: Secondary | ICD-10-CM | POA: Diagnosis not present

## 2023-01-05 DIAGNOSIS — R7303 Prediabetes: Secondary | ICD-10-CM | POA: Diagnosis not present

## 2023-01-05 DIAGNOSIS — M5432 Sciatica, left side: Secondary | ICD-10-CM | POA: Diagnosis not present

## 2023-01-05 DIAGNOSIS — M8588 Other specified disorders of bone density and structure, other site: Secondary | ICD-10-CM | POA: Diagnosis not present

## 2023-01-05 DIAGNOSIS — R519 Headache, unspecified: Secondary | ICD-10-CM | POA: Diagnosis not present

## 2023-01-05 DIAGNOSIS — Z6832 Body mass index (BMI) 32.0-32.9, adult: Secondary | ICD-10-CM | POA: Diagnosis not present

## 2023-01-05 DIAGNOSIS — E7800 Pure hypercholesterolemia, unspecified: Secondary | ICD-10-CM | POA: Diagnosis not present

## 2023-01-05 DIAGNOSIS — Z0000 Encounter for general adult medical examination without abnormal findings: Secondary | ICD-10-CM | POA: Diagnosis not present

## 2023-01-05 DIAGNOSIS — R7309 Other abnormal glucose: Secondary | ICD-10-CM | POA: Diagnosis not present

## 2023-01-05 DIAGNOSIS — Z23 Encounter for immunization: Secondary | ICD-10-CM | POA: Diagnosis not present

## 2023-01-05 DIAGNOSIS — R159 Full incontinence of feces: Secondary | ICD-10-CM | POA: Diagnosis not present

## 2023-04-20 DIAGNOSIS — M25561 Pain in right knee: Secondary | ICD-10-CM | POA: Diagnosis not present

## 2023-04-20 DIAGNOSIS — M5451 Vertebrogenic low back pain: Secondary | ICD-10-CM | POA: Diagnosis not present

## 2023-04-22 ENCOUNTER — Ambulatory Visit: Payer: Medicare HMO | Admitting: Podiatry

## 2023-04-22 ENCOUNTER — Encounter: Payer: Self-pay | Admitting: Podiatry

## 2023-04-22 DIAGNOSIS — L600 Ingrowing nail: Secondary | ICD-10-CM

## 2023-04-22 DIAGNOSIS — M7752 Other enthesopathy of left foot: Secondary | ICD-10-CM | POA: Diagnosis not present

## 2023-04-22 NOTE — Progress Notes (Signed)
Subjective:   Patient ID: Caroline Wilson, female   DOB: 77 y.o.   MRN: [id number]   HPI Patient presents with 2 problems with 1 being swelling and pain in the ankle left over right and #2 chronic problems with her big toenails with the entire toenail left involved and the medial border of the right stating they are very tender.  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     Plan:  H&P reviewed both conditions discussed treatment options she is opted for surgical correction of nailbeds bilateral and I discussed permanent procedure allowed her to read then signed consent form and I infiltrated each big toe 60 mg like Marcaine mixture sterile prep done using sterile instrumentation I removed the hallux nail left exposed matrix applied sodium hydroxide 5 applications 8 seconds followed by acetic acid lavage sterile dressing and for the right remove the medial border applied 3 applications sodium hydroxide 8 seconds followed by acetic acid lavage and sterile dressing.  I advised on leaving these on 24 hours take them off earlier if throbbing were to occur.  For the ankle I have advised on compression therapy ice therapy oral anti-inflammatories and if symptoms persist we will consider injections

## 2023-04-22 NOTE — Patient Instructions (Signed)

## 2023-04-28 DIAGNOSIS — M1711 Unilateral primary osteoarthritis, right knee: Secondary | ICD-10-CM | POA: Diagnosis not present

## 2023-04-29 ENCOUNTER — Emergency Department (HOSPITAL_COMMUNITY)
Admission: EM | Admit: 2023-04-29 | Discharge: 2023-04-30 | Disposition: A | Discharge status: Home / Self Care | Payer: Medicare HMO | Attending: Emergency Medicine | Admitting: Emergency Medicine

## 2023-04-29 DIAGNOSIS — M1711 Unilateral primary osteoarthritis, right knee: Secondary | ICD-10-CM | POA: Diagnosis not present

## 2023-04-29 DIAGNOSIS — M25561 Pain in right knee: Secondary | ICD-10-CM | POA: Insufficient documentation

## 2023-04-29 DIAGNOSIS — M7989 Other specified soft tissue disorders: Secondary | ICD-10-CM

## 2023-04-29 DIAGNOSIS — M25461 Effusion, right knee: Secondary | ICD-10-CM | POA: Diagnosis not present

## 2023-04-30 ENCOUNTER — Other Ambulatory Visit: Payer: Self-pay

## 2023-04-30 ENCOUNTER — Emergency Department (HOSPITAL_COMMUNITY): Payer: Medicare HMO

## 2023-04-30 ENCOUNTER — Encounter (HOSPITAL_COMMUNITY): Payer: Self-pay | Admitting: Emergency Medicine

## 2023-04-30 DIAGNOSIS — M25561 Pain in right knee: Secondary | ICD-10-CM | POA: Diagnosis not present

## 2023-04-30 DIAGNOSIS — M1711 Unilateral primary osteoarthritis, right knee: Secondary | ICD-10-CM | POA: Diagnosis not present

## 2023-04-30 DIAGNOSIS — M25461 Effusion, right knee: Secondary | ICD-10-CM | POA: Diagnosis not present

## 2023-04-30 LAB — CBC WITH DIFFERENTIAL/PLATELET
Abs Immature Granulocytes: 0.01 10*3/uL (ref 0.00–0.07)
Basophils Absolute: 0 10*3/uL (ref 0.0–0.1)
Basophils Relative: 0 %
Eosinophils Absolute: 0 10*3/uL (ref 0.0–0.5)
Eosinophils Relative: 0 %
HCT: 41.1 % (ref 36.0–46.0)
Hemoglobin: 12.8 g/dL (ref 12.0–15.0)
Immature Granulocytes: 0 %
Lymphocytes Relative: 19 %
Lymphs Abs: 1.4 10*3/uL (ref 0.7–4.0)
MCH: 27.9 pg (ref 26.0–34.0)
MCHC: 31.1 g/dL (ref 30.0–36.0)
MCV: 89.7 fL (ref 80.0–100.0)
Monocytes Absolute: 0.5 10*3/uL (ref 0.1–1.0)
Monocytes Relative: 6 %
Neutro Abs: 5.3 10*3/uL (ref 1.7–7.7)
Neutrophils Relative %: 75 %
Platelets: 233 10*3/uL (ref 150–400)
RBC: 4.58 MIL/uL (ref 3.87–5.11)
RDW: 13.3 % (ref 11.5–15.5)
WBC: 7.2 10*3/uL (ref 4.0–10.5)
nRBC: 0 % (ref 0.0–0.2)

## 2023-04-30 LAB — I-STAT CHEM 8, ED
BUN: 22 mg/dL (ref 8–23)
Calcium, Ion: 1.27 mmol/L (ref 1.15–1.40)
Chloride: 106 mmol/L (ref 98–111)
Creatinine, Ser: 0.9 mg/dL (ref 0.44–1.00)
Glucose, Bld: 123 mg/dL — ABNORMAL HIGH (ref 70–99)
HCT: 42 % (ref 36.0–46.0)
Hemoglobin: 14.3 g/dL (ref 12.0–15.0)
Potassium: 4.3 mmol/L (ref 3.5–5.1)
Sodium: 142 mmol/L (ref 135–145)
TCO2: 24 mmol/L (ref 22–32)

## 2023-04-30 LAB — COMPREHENSIVE METABOLIC PANEL
ALT: 18 U/L (ref 0–44)
AST: 22 U/L (ref 15–41)
Albumin: 4.1 g/dL (ref 3.5–5.0)
Alkaline Phosphatase: 49 U/L (ref 38–126)
Anion gap: 9 (ref 5–15)
BUN: 22 mg/dL (ref 8–23)
CO2: 24 mmol/L (ref 22–32)
Calcium: 9.4 mg/dL (ref 8.9–10.3)
Chloride: 104 mmol/L (ref 98–111)
Creatinine, Ser: 0.96 mg/dL (ref 0.44–1.00)
GFR, Estimated: >60 mL/min (ref >60)
Glucose, Bld: 121 mg/dL — ABNORMAL HIGH (ref 70–99)
Potassium: 4.3 mmol/L (ref 3.5–5.1)
Sodium: 137 mmol/L (ref 135–145)
Total Bilirubin: 0.6 mg/dL (ref 0.3–1.2)
Total Protein: 8.2 g/dL — ABNORMAL HIGH (ref 6.5–8.1)

## 2023-04-30 MED ORDER — HYDROCODONE-ACETAMINOPHEN 5-325 MG PO TABS: 1.0000 | ORAL_TABLET | Freq: Four times a day (QID) | ORAL | 0 refills | Status: AC | PRN

## 2023-04-30 MED ORDER — HYDROCODONE-ACETAMINOPHEN 5-325 MG PO TABS
1.0000 | ORAL_TABLET | Freq: Once | ORAL | Status: AC
  Administered 2023-04-30: 1 via ORAL
  Filled 2023-04-30: qty 1

## 2023-04-30 NOTE — Discharge Instructions (Addendum)
Unfortunately, your ultrasound will need to be done at [facility] later today. You can try calling the [facility] but will likely need to follow the instructions below for your study to be done at [facility].   IMPORTANT PATIENT INSTRUCTIONS:  You have been scheduled for an Outpatient Vascular Study at [facility].    If tomorrow is a Saturday, Sunday or holiday, please go to the [facility] Emergency Department Registration Desk at 11 am tomorrow morning and tell them you are there for a vascular study.   If tomorrow is a weekday (Monday-Friday), please go to [facility] En[facility] Registration at 11 am and tell them you are there for a vascular study.   Take Norco as needed for pain as prescribed. Do not drive while taking this medication. Take Miralax as needed for constipation caused by Norco.   Follow up with your orthopedic specialist.

## 2023-04-30 NOTE — ED Triage Notes (Signed)
Pt reports right leg pain. Pain is right behind knee. Reports she has had pain 1 week and has gotten progressively worse.

## 2023-04-30 NOTE — ED Provider Notes (Signed)
[place] EMERGENCY DEPARTMENT AT [facility] Provider Note   CSN: [id number] Arrival date & time: 04/29/23  2355     History  Chief Complaint  Patient presents with   Leg Pain    Caroline Wilson is a 77 y.o. female.  77 year old female presents with worsening pain in her right knee. Patient was seen in clinic at [facility], treated with PO steroid which helped a little. Patient returned to ortho and was treated with intraarticular injection without any relief of her pain. States pain more so in the posterior knee, now radiating down her right calf. No falls or injuries, no history of PE/DVT, not anticoagulated. Pain is so severe she is having difficulty applying weight on her leg even with walking with a walker.        Home Medications Prior to Admission medications   Medication Sig Start Date End Date Taking? Authorizing Provider  HYDROcodone-acetaminophen (NORCO) 5-325 MG tablet Take 1 tablet by mouth every 6 (six) hours as needed for moderate pain. 04/30/23  Yes Jeannie Fend, PA-C  amoxicillin-clavulanate (AUGMENTIN) 875-125 MG tablet  05/03/20   [provider]  cholecalciferol (VITAMIN D) 1000 units tablet Take 1,000 Units by mouth daily.    [provider]  COVID-19 mRNA bivalent vaccine, Pfizer, injection Inject into the muscle. 05/18/21   Judyann Munson, MD  diclofenac (VOLTAREN) 75 MG EC tablet Take 1 tablet (75 mg total) by mouth 2 (two) times daily. 07/04/20   Lenn Sink, DPM  Multiple Vitamin (MULTIVITAMIN) tablet Take 1 tablet by mouth daily.    [provider]  naproxen (NAPROSYN) 500 MG tablet Take 1 tablet (500 mg total) by mouth every 6 (six) hours as needed. 07/06/16   Levert Feinstein, MD  naproxen sodium (ALEVE) 220 MG tablet Take 220 mg by mouth daily as needed.    [provider]  PARoxetine (PAXIL) 10 MG tablet Take 10 mg by mouth daily. 08/14/18   [provider]  psyllium (METAMUCIL) 58.6 % packet Take  1 packet by mouth daily.    [provider]  rosuvastatin (CRESTOR) 5 MG tablet Take 5 mg by mouth daily at 6 PM.    [provider]  tiZANidine (ZANAFLEX) 2 MG tablet TAKE 1 TABLET BY MOUTH EVERY 8 HOURS AS NEEDED FOR MUSCLE SPASM 12/27/18   [provider]  Turmeric (QC TUMERIC COMPLEX PO) Take by mouth.    [provider]      Allergies    Patient has no known allergies.    Review of Systems   Review of Systems Negative except as per HPI Physical Exam Updated Vital Signs BP (!) 181/87 (BP Location: Left Arm)   Pulse (!) 54   Temp 98.2 F (36.8 C) (Oral)   Resp 18   SpO2 98%  Physical Exam Vitals and nursing note reviewed.  Constitutional:      General: She is not in acute distress.    Appearance: She is well-developed. She is not diaphoretic.  HENT:     Head: Normocephalic and atraumatic.  Cardiovascular:     Pulses: Normal pulses.  Pulmonary:     Effort: Pulmonary effort is normal.  Musculoskeletal:        General: Swelling and tenderness present. No deformity or signs of injury.     Right knee: Bony tenderness present.     Comments: Able to flex to 90 degrees. Notably tender to the anterior knee and medial joint line. No  posterior tenderness or pain in her calf, no palpable cords. Dp pulse present.   Skin:    General: Skin is warm and dry.     Findings: No erythema or rash.  Neurological:     Mental Status: She is alert and oriented to person, place, and time.     Sensory: No sensory deficit.  Psychiatric:        Behavior: Behavior normal.     ED Results / Procedures / Treatments   Labs (all labs ordered are listed, but only abnormal results are displayed) Labs Reviewed  COMPREHENSIVE METABOLIC PANEL - Abnormal; Notable for the following components:      Result Value   Glucose, Bld 121 (*)    Total Protein 8.2 (*)    All other components within normal limits  I-STAT CHEM 8, ED - Abnormal; Notable for the following  components:   Glucose, Bld 123 (*)    All other components within normal limits  CBC WITH DIFFERENTIAL/PLATELET    EKG None  Radiology DG Knee Complete 4 Views Right  Result Date: 04/30/2023 CLINICAL DATA:  Worsening right knee pain. EXAM: RIGHT KNEE - COMPLETE 4+ VIEW COMPARISON:  None Available. FINDINGS: No evidence of an acute fracture or dislocation. Medial and lateral marginal osteophytes are seen. There is marked severity patellofemoral compartment space narrowing, with mild medial and lateral tibiofemoral compartment space narrowing. Mild medial and lateral chondrocalcinosis is also noted. There is a small to moderate sized joint effusion. IMPRESSION: 1. 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 2. Small to moderate sized joint effusion. Electronically Signed   By: Aram Candela M.D.   On: 04/30/2023 00:43    Procedures Procedures    Medications Ordered in ED Medications  HYDROcodone-acetaminophen (NORCO/VICODIN) 5-325 MG per tablet 1 tablet (1 tablet Oral Given 04/30/23 0545)    ED Course/ Medical Decision Making/ A&P                                 Medical Decision Making Amount and/or Complexity of Data Reviewed Labs: ordered. Radiology: ordered.  Risk Prescription drug management.   77 year old female with right knee pain, unrelieved with injection from ortho earlier this week. XR with tricompartmental arthritis, labs unrevealing. Suspect pain is due to her arthritis. Patient is concerned about DVT. Doppler is unavailable at this hour. Study has been ordered for patient to return in the morning for her doppler study (at [facility]).         Final Clinical Impression(s) / ED Diagnoses Final diagnoses:  Acute pain of right knee    Rx / DC Orders ED Discharge Orders          Ordered    UE VENOUS DUPLEX  Status:  Canceled        04/30/23 0533    UE VENOUS DUPLEX        04/30/23 0544    HYDROcodone-acetaminophen  (NORCO) 5-325 MG tablet  Every 6 hours PRN        04/30/23 0546              Jeannie Fend, PA-C 04/30/23 0559    Palumbo, April, MD 04/30/23 [id number]

## 2023-05-02 ENCOUNTER — Ambulatory Visit (HOSPITAL_COMMUNITY): Admission: RE | Admit: 2023-05-02 | Payer: Medicare HMO | Source: Ambulatory Visit

## 2023-05-02 DIAGNOSIS — M25561 Pain in right knee: Secondary | ICD-10-CM | POA: Diagnosis not present

## 2023-05-03 ENCOUNTER — Ambulatory Visit (HOSPITAL_COMMUNITY)
Admission: RE | Admit: 2023-05-03 | Discharge: 2023-05-03 | Disposition: A | Discharge status: Home / Self Care | Payer: Medicare HMO | Source: Ambulatory Visit | Attending: Emergency Medicine | Admitting: Emergency Medicine

## 2023-05-03 DIAGNOSIS — M79661 Pain in right lower leg: Secondary | ICD-10-CM

## 2023-05-03 DIAGNOSIS — M7989 Other specified soft tissue disorders: Secondary | ICD-10-CM

## 2023-05-03 DIAGNOSIS — E7800 Pure hypercholesterolemia, unspecified: Secondary | ICD-10-CM | POA: Diagnosis not present

## 2023-05-06 DIAGNOSIS — M25561 Pain in right knee: Secondary | ICD-10-CM | POA: Diagnosis not present

## 2023-05-18 DIAGNOSIS — Z23 Encounter for immunization: Secondary | ICD-10-CM | POA: Diagnosis not present

## 2023-05-18 DIAGNOSIS — Z6832 Body mass index (BMI) 32.0-32.9, adult: Secondary | ICD-10-CM | POA: Diagnosis not present

## 2023-05-18 DIAGNOSIS — R7303 Prediabetes: Secondary | ICD-10-CM | POA: Diagnosis not present

## 2023-05-18 DIAGNOSIS — M8588 Other specified disorders of bone density and structure, other site: Secondary | ICD-10-CM | POA: Diagnosis not present

## 2023-05-18 DIAGNOSIS — M179 Osteoarthritis of knee, unspecified: Secondary | ICD-10-CM | POA: Diagnosis not present

## 2023-05-18 DIAGNOSIS — E7800 Pure hypercholesterolemia, unspecified: Secondary | ICD-10-CM | POA: Diagnosis not present

## 2023-05-26 DIAGNOSIS — D0511 Intraductal carcinoma in situ of right breast: Secondary | ICD-10-CM | POA: Diagnosis not present

## 2023-06-22 DIAGNOSIS — M94261 Chondromalacia, right knee: Secondary | ICD-10-CM | POA: Diagnosis not present

## 2023-06-22 DIAGNOSIS — S83231A Complex tear of medial meniscus, current injury, right knee, initial encounter: Secondary | ICD-10-CM | POA: Diagnosis not present

## 2023-06-22 DIAGNOSIS — M109 Gout, unspecified: Secondary | ICD-10-CM | POA: Diagnosis not present

## 2023-06-22 DIAGNOSIS — M25761 Osteophyte, right knee: Secondary | ICD-10-CM | POA: Diagnosis not present

## 2023-06-22 DIAGNOSIS — X58XXXA Exposure to other specified factors, initial encounter: Secondary | ICD-10-CM | POA: Diagnosis not present

## 2023-06-22 DIAGNOSIS — S83241A Other tear of medial meniscus, current injury, right knee, initial encounter: Secondary | ICD-10-CM | POA: Diagnosis not present

## 2023-06-22 DIAGNOSIS — G8918 Other acute postprocedural pain: Secondary | ICD-10-CM | POA: Diagnosis not present

## 2023-06-22 DIAGNOSIS — S83281A Other tear of lateral meniscus, current injury, right knee, initial encounter: Secondary | ICD-10-CM | POA: Diagnosis not present

## 2023-06-22 DIAGNOSIS — M1711 Unilateral primary osteoarthritis, right knee: Secondary | ICD-10-CM | POA: Diagnosis not present

## 2023-06-22 DIAGNOSIS — Y999 Unspecified external cause status: Secondary | ICD-10-CM | POA: Diagnosis not present

## 2023-06-27 DIAGNOSIS — E7800 Pure hypercholesterolemia, unspecified: Secondary | ICD-10-CM | POA: Diagnosis not present

## 2023-06-27 DIAGNOSIS — R7309 Other abnormal glucose: Secondary | ICD-10-CM | POA: Diagnosis not present

## 2023-07-11 ENCOUNTER — Other Ambulatory Visit: Payer: Self-pay | Admitting: Family Medicine

## 2023-07-11 DIAGNOSIS — Z1231 Encounter for screening mammogram for malignant neoplasm of breast: Secondary | ICD-10-CM

## 2023-07-13 DIAGNOSIS — Z87828 Personal history of other (healed) physical injury and trauma: Secondary | ICD-10-CM | POA: Diagnosis not present

## 2023-07-13 DIAGNOSIS — M8588 Other specified disorders of bone density and structure, other site: Secondary | ICD-10-CM | POA: Diagnosis not present

## 2023-07-13 DIAGNOSIS — Z6832 Body mass index (BMI) 32.0-32.9, adult: Secondary | ICD-10-CM | POA: Diagnosis not present

## 2023-07-13 DIAGNOSIS — R7303 Prediabetes: Secondary | ICD-10-CM | POA: Diagnosis not present

## 2023-07-13 DIAGNOSIS — E7800 Pure hypercholesterolemia, unspecified: Secondary | ICD-10-CM | POA: Diagnosis not present

## 2023-07-13 DIAGNOSIS — R159 Full incontinence of feces: Secondary | ICD-10-CM | POA: Diagnosis not present

## 2023-07-13 DIAGNOSIS — M5432 Sciatica, left side: Secondary | ICD-10-CM | POA: Diagnosis not present

## 2023-07-28 DIAGNOSIS — M25561 Pain in right knee: Secondary | ICD-10-CM | POA: Diagnosis not present

## 2023-07-28 DIAGNOSIS — M25661 Stiffness of right knee, not elsewhere classified: Secondary | ICD-10-CM | POA: Diagnosis not present

## 2023-08-02 DIAGNOSIS — M25561 Pain in right knee: Secondary | ICD-10-CM | POA: Diagnosis not present

## 2023-08-08 DIAGNOSIS — H2513 Age-related nuclear cataract, bilateral: Secondary | ICD-10-CM | POA: Diagnosis not present

## 2023-08-08 DIAGNOSIS — H52223 Regular astigmatism, bilateral: Secondary | ICD-10-CM | POA: Diagnosis not present

## 2023-08-08 DIAGNOSIS — H5213 Myopia, bilateral: Secondary | ICD-10-CM | POA: Diagnosis not present

## 2023-08-08 DIAGNOSIS — Z135 Encounter for screening for eye and ear disorders: Secondary | ICD-10-CM | POA: Diagnosis not present

## 2023-08-08 DIAGNOSIS — H524 Presbyopia: Secondary | ICD-10-CM | POA: Diagnosis not present

## 2023-08-09 DIAGNOSIS — M25561 Pain in right knee: Secondary | ICD-10-CM | POA: Diagnosis not present

## 2023-08-10 ENCOUNTER — Ambulatory Visit
Admission: RE | Admit: 2023-08-10 | Discharge: 2023-08-10 | Disposition: A | Discharge status: Home / Self Care | Payer: Medicare HMO | Source: Ambulatory Visit | Attending: Family Medicine | Admitting: Family Medicine

## 2023-08-10 DIAGNOSIS — Z1231 Encounter for screening mammogram for malignant neoplasm of breast: Secondary | ICD-10-CM | POA: Diagnosis not present

## 2023-08-11 DIAGNOSIS — M25561 Pain in right knee: Secondary | ICD-10-CM | POA: Diagnosis not present

## 2023-08-22 DIAGNOSIS — M25561 Pain in right knee: Secondary | ICD-10-CM | POA: Diagnosis not present

## 2023-09-01 DIAGNOSIS — M25561 Pain in right knee: Secondary | ICD-10-CM | POA: Diagnosis not present

## 2023-09-08 DIAGNOSIS — M25561 Pain in right knee: Secondary | ICD-10-CM | POA: Diagnosis not present

## 2023-09-13 DIAGNOSIS — M25561 Pain in right knee: Secondary | ICD-10-CM | POA: Diagnosis not present

## 2023-09-15 ENCOUNTER — Ambulatory Visit (INDEPENDENT_AMBULATORY_CARE_PROVIDER_SITE_OTHER): Payer: Medicare HMO

## 2023-09-15 ENCOUNTER — Encounter: Payer: Self-pay | Admitting: Podiatry

## 2023-09-15 ENCOUNTER — Ambulatory Visit: Payer: Medicare HMO | Admitting: Podiatry

## 2023-09-15 VITALS — Ht 62.0 in | Wt 180.0 lb

## 2023-09-15 DIAGNOSIS — S99922A Unspecified injury of left foot, initial encounter: Secondary | ICD-10-CM

## 2023-09-15 DIAGNOSIS — S92515A Nondisplaced fracture of proximal phalanx of left lesser toe(s), initial encounter for closed fracture: Secondary | ICD-10-CM

## 2023-09-15 DIAGNOSIS — M25561 Pain in right knee: Secondary | ICD-10-CM | POA: Diagnosis not present

## 2023-09-15 NOTE — Progress Notes (Signed)
Subjective:   Patient ID: Caroline Wilson, female   DOB: 78 y.o.   MRN: [id number]   HPI Patient presents stating her fifth toe left has been very swollen and 2 weeks ago she hit it and she is worried it is broken   ROS      Objective:  Physical Exam  Neuro vascular status intact swelling of the fifth digit left especially around the head of the proximal phalanx very sore when pressed into this area     Assessment:  Probability for fracture fifth digit left foot     Plan:  H&P x-ray taken reviewed advised on wider shoes mesh materials ice as needed but being careful with that and that this will take 8 to 12 weeks for complete healing.  Reappoint if symptoms persist  X-rays indicate fracture across the head of the proximal phalanx digit 5 left not displaced

## 2023-09-27 DIAGNOSIS — M25561 Pain in right knee: Secondary | ICD-10-CM | POA: Diagnosis not present

## 2023-09-27 DIAGNOSIS — M25661 Stiffness of right knee, not elsewhere classified: Secondary | ICD-10-CM | POA: Diagnosis not present

## 2023-09-30 DIAGNOSIS — M25561 Pain in right knee: Secondary | ICD-10-CM | POA: Diagnosis not present

## 2023-10-07 DIAGNOSIS — M1711 Unilateral primary osteoarthritis, right knee: Secondary | ICD-10-CM | POA: Diagnosis not present

## 2023-10-14 DIAGNOSIS — M1711 Unilateral primary osteoarthritis, right knee: Secondary | ICD-10-CM | POA: Diagnosis not present

## 2023-10-18 DIAGNOSIS — M25661 Stiffness of right knee, not elsewhere classified: Secondary | ICD-10-CM | POA: Diagnosis not present

## 2023-10-18 DIAGNOSIS — M25561 Pain in right knee: Secondary | ICD-10-CM | POA: Diagnosis not present

## 2023-10-19 DIAGNOSIS — E7800 Pure hypercholesterolemia, unspecified: Secondary | ICD-10-CM | POA: Diagnosis not present

## 2023-10-21 DIAGNOSIS — M1711 Unilateral primary osteoarthritis, right knee: Secondary | ICD-10-CM | POA: Diagnosis not present

## 2024-01-10 DIAGNOSIS — R7303 Prediabetes: Secondary | ICD-10-CM | POA: Diagnosis not present

## 2024-01-10 DIAGNOSIS — E7800 Pure hypercholesterolemia, unspecified: Secondary | ICD-10-CM | POA: Diagnosis not present

## 2024-01-23 DIAGNOSIS — Z86000 Personal history of in-situ neoplasm of breast: Secondary | ICD-10-CM | POA: Diagnosis not present

## 2024-01-23 DIAGNOSIS — R7303 Prediabetes: Secondary | ICD-10-CM | POA: Diagnosis not present

## 2024-01-23 DIAGNOSIS — Z6831 Body mass index (BMI) 31.0-31.9, adult: Secondary | ICD-10-CM | POA: Diagnosis not present

## 2024-01-23 DIAGNOSIS — E669 Obesity, unspecified: Secondary | ICD-10-CM | POA: Diagnosis not present

## 2024-01-23 DIAGNOSIS — R5383 Other fatigue: Secondary | ICD-10-CM | POA: Diagnosis not present

## 2024-01-23 DIAGNOSIS — M4800 Spinal stenosis, site unspecified: Secondary | ICD-10-CM | POA: Diagnosis not present

## 2024-01-23 DIAGNOSIS — M159 Polyosteoarthritis, unspecified: Secondary | ICD-10-CM | POA: Diagnosis not present

## 2024-01-23 DIAGNOSIS — M899 Disorder of bone, unspecified: Secondary | ICD-10-CM | POA: Diagnosis not present

## 2024-01-23 DIAGNOSIS — M8588 Other specified disorders of bone density and structure, other site: Secondary | ICD-10-CM | POA: Diagnosis not present

## 2024-01-23 DIAGNOSIS — E7800 Pure hypercholesterolemia, unspecified: Secondary | ICD-10-CM | POA: Diagnosis not present

## 2024-02-09 DIAGNOSIS — H5213 Myopia, bilateral: Secondary | ICD-10-CM | POA: Diagnosis not present

## 2024-02-09 DIAGNOSIS — H52209 Unspecified astigmatism, unspecified eye: Secondary | ICD-10-CM | POA: Diagnosis not present

## 2024-02-09 DIAGNOSIS — H524 Presbyopia: Secondary | ICD-10-CM | POA: Diagnosis not present

## 2024-06-02 DIAGNOSIS — R52 Pain, unspecified: Secondary | ICD-10-CM | POA: Diagnosis not present

## 2024-06-02 DIAGNOSIS — R0981 Nasal congestion: Secondary | ICD-10-CM | POA: Diagnosis not present

## 2024-06-02 DIAGNOSIS — R051 Acute cough: Secondary | ICD-10-CM | POA: Diagnosis not present

## 2024-06-12 ENCOUNTER — Other Ambulatory Visit: Payer: Self-pay | Admitting: General Surgery

## 2024-06-12 DIAGNOSIS — R590 Localized enlarged lymph nodes: Secondary | ICD-10-CM | POA: Diagnosis not present

## 2024-06-12 DIAGNOSIS — D0511 Intraductal carcinoma in situ of right breast: Secondary | ICD-10-CM | POA: Diagnosis not present

## 2024-06-12 DIAGNOSIS — Z1231 Encounter for screening mammogram for malignant neoplasm of breast: Secondary | ICD-10-CM

## 2024-06-12 DIAGNOSIS — Z853 Personal history of malignant neoplasm of breast: Secondary | ICD-10-CM | POA: Diagnosis not present

## 2024-07-24 DIAGNOSIS — R7303 Prediabetes: Secondary | ICD-10-CM | POA: Diagnosis not present

## 2024-07-24 DIAGNOSIS — M8588 Other specified disorders of bone density and structure, other site: Secondary | ICD-10-CM | POA: Diagnosis not present

## 2024-07-24 DIAGNOSIS — E7800 Pure hypercholesterolemia, unspecified: Secondary | ICD-10-CM | POA: Diagnosis not present

## 2024-08-10 ENCOUNTER — Inpatient Hospital Stay: Admission: RE | Admit: 2024-08-10 | Discharge: 2024-08-10 | Attending: General Surgery | Admitting: General Surgery

## 2024-08-10 ENCOUNTER — Ambulatory Visit
Admission: RE | Admit: 2024-08-10 | Discharge: 2024-08-10 | Disposition: A | Source: Ambulatory Visit | Attending: General Surgery | Admitting: General Surgery

## 2024-08-10 ENCOUNTER — Other Ambulatory Visit: Payer: Self-pay | Admitting: General Surgery

## 2024-08-10 DIAGNOSIS — R928 Other abnormal and inconclusive findings on diagnostic imaging of breast: Secondary | ICD-10-CM

## 2024-08-10 DIAGNOSIS — Z1231 Encounter for screening mammogram for malignant neoplasm of breast: Secondary | ICD-10-CM
